# Patient Record
Sex: Female | Born: 1965 | Race: White | Hispanic: No | Marital: Married | Smoking: Never smoker
Health system: Southern US, Community
[De-identification: ages and names within clinical notes are randomized; demographics above are authoritative.]

## PROBLEM LIST (undated history)

## (undated) DIAGNOSIS — C50919 Malignant neoplasm of unspecified site of unspecified female breast: Secondary | ICD-10-CM

## (undated) DIAGNOSIS — R112 Nausea with vomiting, unspecified: Secondary | ICD-10-CM

## (undated) DIAGNOSIS — IMO0002 Reserved for concepts with insufficient information to code with codable children: Secondary | ICD-10-CM

## (undated) DIAGNOSIS — T8859XA Other complications of anesthesia, initial encounter: Secondary | ICD-10-CM

## (undated) DIAGNOSIS — F909 Attention-deficit hyperactivity disorder, unspecified type: Secondary | ICD-10-CM

## (undated) DIAGNOSIS — F32A Depression, unspecified: Secondary | ICD-10-CM

## (undated) DIAGNOSIS — N3289 Other specified disorders of bladder: Secondary | ICD-10-CM

## (undated) DIAGNOSIS — T4145XA Adverse effect of unspecified anesthetic, initial encounter: Secondary | ICD-10-CM

## (undated) DIAGNOSIS — G43909 Migraine, unspecified, not intractable, without status migrainosus: Secondary | ICD-10-CM

## (undated) DIAGNOSIS — E039 Hypothyroidism, unspecified: Secondary | ICD-10-CM

## (undated) DIAGNOSIS — T7840XA Allergy, unspecified, initial encounter: Secondary | ICD-10-CM

## (undated) DIAGNOSIS — F419 Anxiety disorder, unspecified: Secondary | ICD-10-CM

## (undated) DIAGNOSIS — R55 Syncope and collapse: Secondary | ICD-10-CM

## (undated) DIAGNOSIS — O039 Complete or unspecified spontaneous abortion without complication: Secondary | ICD-10-CM

## (undated) DIAGNOSIS — Z9889 Other specified postprocedural states: Secondary | ICD-10-CM

## (undated) DIAGNOSIS — Z62810 Personal history of physical and sexual abuse in childhood: Secondary | ICD-10-CM

## (undated) HISTORY — DX: Syncope and collapse: R55

## (undated) HISTORY — PX: BUNIONECTOMY: SHX129

## (undated) HISTORY — DX: Reserved for concepts with insufficient information to code with codable children: IMO0002

## (undated) HISTORY — DX: Complete or unspecified spontaneous abortion without complication: O03.9

## (undated) HISTORY — DX: Malignant neoplasm of unspecified site of unspecified female breast: C50.919

## (undated) HISTORY — DX: Personal history of physical and sexual abuse in childhood: Z62.810

---

## 1988-10-05 HISTORY — PX: MEATOPLASTY: SHX2011

## 2001-08-11 ENCOUNTER — Other Ambulatory Visit: Admission: RE | Admit: 2001-08-11 | Discharge: 2001-08-11 | Payer: Self-pay | Admitting: Obstetrics and Gynecology

## 2002-10-23 ENCOUNTER — Other Ambulatory Visit: Admission: RE | Admit: 2002-10-23 | Discharge: 2002-10-23 | Payer: Self-pay | Admitting: Obstetrics and Gynecology

## 2003-04-10 ENCOUNTER — Encounter: Admission: RE | Admit: 2003-04-10 | Discharge: 2003-04-10 | Payer: Self-pay | Admitting: Obstetrics and Gynecology

## 2003-04-10 ENCOUNTER — Encounter: Payer: Self-pay | Admitting: Obstetrics and Gynecology

## 2004-02-01 ENCOUNTER — Other Ambulatory Visit: Admission: RE | Admit: 2004-02-01 | Discharge: 2004-02-01 | Payer: Self-pay | Admitting: Obstetrics and Gynecology

## 2005-02-19 ENCOUNTER — Other Ambulatory Visit: Admission: RE | Admit: 2005-02-19 | Discharge: 2005-02-19 | Payer: Self-pay | Admitting: Obstetrics and Gynecology

## 2005-03-12 ENCOUNTER — Ambulatory Visit: Payer: Self-pay | Admitting: Cardiology

## 2005-04-02 ENCOUNTER — Ambulatory Visit: Payer: Self-pay | Admitting: Cardiology

## 2006-04-20 ENCOUNTER — Ambulatory Visit: Payer: Self-pay | Admitting: Cardiology

## 2006-12-20 ENCOUNTER — Ambulatory Visit (HOSPITAL_COMMUNITY): Admission: RE | Admit: 2006-12-20 | Discharge: 2006-12-20 | Payer: Self-pay | Admitting: Obstetrics and Gynecology

## 2007-09-16 ENCOUNTER — Ambulatory Visit (HOSPITAL_COMMUNITY): Admission: RE | Admit: 2007-09-16 | Discharge: 2007-09-16 | Payer: Self-pay | Admitting: Obstetrics and Gynecology

## 2008-03-20 ENCOUNTER — Inpatient Hospital Stay (HOSPITAL_COMMUNITY): Admission: AD | Admit: 2008-03-20 | Discharge: 2008-03-24 | Payer: Self-pay | Admitting: Obstetrics & Gynecology

## 2008-03-20 ENCOUNTER — Encounter (INDEPENDENT_AMBULATORY_CARE_PROVIDER_SITE_OTHER): Payer: Self-pay | Admitting: Obstetrics & Gynecology

## 2008-04-02 ENCOUNTER — Ambulatory Visit: Admission: RE | Admit: 2008-04-02 | Discharge: 2008-04-02 | Payer: Self-pay | Admitting: Obstetrics and Gynecology

## 2008-04-04 HISTORY — PX: TUBAL LIGATION: SHX77
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
## 2011-02-17 NOTE — Op Note (Signed)
Kristin Spears, Kristin Spears               ACCOUNT NO.:  [id number]   MEDICAL RECORD NO.:  [id number]          PATIENT TYPE:  AMB   LOCATION:  SDC                           [facility]ILITY:  [facility]   PHYSICIAN:  Guy Sandifer. Henderson Cloud, M.D. DATE OF BIRTH:  Oct 29, 1965   DATE OF PROCEDURE:  09/16/2007  DATE OF DISCHARGE:                               OPERATIVE REPORT   PREOPERATIVE DIAGNOSES:  1. Anterior pregnancy at 13 weeks' estimated additional age..  2. T-shaped uterus.   POSTOPERATIVE DIAGNOSES:  1. Anterior pregnancy at 13 weeks' estimated additional age..  2. T-shaped uterus.   PROCEDURE:  McDonald cervical cerclage.   SURGEON:  Guy Sandifer. Henderson Cloud, M.D.   ANESTHESIA:  Spinal.   BLOOD LOSS:  Minimal.   INDICATIONS AND CONSENT:  This patient is a 45 year old married white  female G3, P0, at approximately 9 weeks' estimated additional age.  She  has a T-shaped uterus.  Details are dictated in the history and  physical.  She is admitted for a McDonald cervical cerclage.  Potential  risks and complications have been discussed preoperatively including but  not limited to infection, bleeding with transfusion of blood products  with HIV and hepatitis acquisition, pregnancy loss.  All questions have  been answered and consent is signed on the chart.   PROCEDURE:  The patient is taken to the operating room, where she is  identified, a spinal anesthetic is placed, and she is placed in the  dorsal supine position.  She is then prepped, the bladder straight-  catheterized, and draped in a sterile fashion.  The cervix is closed,  thick and high.  A weighted speculum is placed and the anterior  retractor is placed.  Novofil #1 is used to perform a McDonald cerclage  starting and ending at the 12 o'clock position without difficulty.  Good  hemostasis is noted at the completion of the cerclage.  All counts are  correct.  The patient is transferred to the recovery room in stable  condition.      Guy Sandifer  Henderson Cloud, M.D.  Electronically Signed     JET/MEDQ  D:  09/16/2007  T:  09/16/2007  Job:  [id number]

## 2011-02-17 NOTE — Op Note (Signed)
Kristin Spears, Kristin Spears               ACCOUNT NO.:  [id number]   MEDICAL RECORD NO.:  [id number]          PATIENT TYPE:  INP   LOCATION:  9128                          [facility]ILITY:  [facility]   PHYSICIAN:  Guy Sandifer. Henderson Cloud, M.D. DATE OF BIRTH:  04/16/1966   DATE OF PROCEDURE:  03/20/2008  DATE OF DISCHARGE:                               OPERATIVE REPORT   PREOPERATIVE DIAGNOSES:  1. Intrauterine pregnancy at 48-0/7th weeks' estimated gestational      age.  2. Arrest of cervical dilation.  3. Desires permanent sterilization.   POSTOPERATIVE DIAGNOSES:  1. Intrauterine pregnancy at 48-0/7th weeks' estimated gestational      age.  2. Arrest of cervical dilation.  3. Desires permanent sterilization.   PROCEDURE:  Low-transverse cesarean section and bilateral tubal  ligation.   SURGEON:  Harold Hedge, MD   ASSISTANT:  Freddy Finner, MD   ANESTHESIA:  Epidural.   ESTIMATED BLOOD LOSS:  600 mL.   FINDINGS:  Viable female infant, Apgars of 9 at 9 and 1 at 5 minutes  respectively.  Birth weight and arterial pH are pending.   SPECIMENS:  Placenta to pathology.   INDICATIONS AND CONSENT:  This patient is a 45 year old married white  female G3, P0 with an EDC of March 20, 2008.  Pregnancy has been  complicated by a T-shaped uterus on HSG.  A cerclage was placed early in  pregnancy and was removed at 37 weeks.  Pregnancy has also been  complicated by advanced maternal age.  First trimester screening was  normal.  Group B beta strep culture is negative.  The patient presents  with uterine contractions.  As of 2 p.m., cervix is 2 cm dilated, 90%  effaced, -3 station, and vertex.  Artificial rupture of membranes is  productive of light green meconium.  The patient progresses to 5-cm  dilation with no change with adequate labor.  Cesarean section was then  discussed with the patient.  The patient also requests permanent  sterilization.  Discussed with the patient and her husband permanence of  the procedure, failure rate, and increased ectopic risk.  All questions  have been answered and consent is signed on the chart.   PROCEDURE:  The patient is taken to the operating room where epidural  anesthetic is augmented to a surgical level.  She is then placed in  dorsal supine position with 15-degree left lateral wedge.  She is  prepped, Foley catheter was already in place, and draped in a sterile  fashion.  After testing for adequate epidural anesthesia, skin is  entered through a Pfannenstiel incision and dissection is carried out in  layers to the peritoneum.  Peritoneum is incised, extended superiorly  and inferiorly.  Vesicouterine peritoneum was taken down cephalad  laterally.  Bladder flap was developed and the bladder blade was placed.  Uterus was incised in low-transverse manner, and the uterine cavity is  entered bluntly with a hemostat.  The uterine incision is extended  cephalad laterally.  Vertex was then delivered and the oropharynx and  nasopharynx are suctioned with the DeLee suction.  This is productive of a small amount of very very thin meconium.  Remainder of the baby is then delivered without difficulty.  Oropharynx  and nasopharynx are suctioned.  Cord is clamped and cut, and the baby  was handed away to the pediatrics team.  Placenta is manually delivered  and sent to pathology.  Uterine cavity is clean.  Uterus is closed in  two running locking imbricating layers of 0 Monocryl suture, which  achieved good hemostasis.  A single figure-of-eight is placed on the  left angle of the uterine incision to achieve complete hemostasis.   Left fallopian tube was identified from cornua to fimbriae.  It is  grasped in its mid-ampullary portion with a Babcock clamp.  A knuckle of  tube was then doubly ligated with 2 free ties of 0 plain suture.  The  intervening knuckle of tube is then resected in sharp fashion.  Cautery  was used to obtain complete hemostasis.  Similar  procedure was carried  out on the right tube.  Both ovaries appear normal.  Irrigation is  carried out.  Anterior peritoneum was then closed in running fashion  with 0 Monocryl suture, which was also used to reapproximate the  pyramidalis muscle in the midline.  Anterior rectus fascia is closed in  a running fashion with 0 PDS suture, and the skin is closed with clips.  All sponge, instrument, and needle counts were correct; and the patient  is transferred to the recovery room in stable condition.      Guy Sandifer Henderson Cloud, M.D.  Electronically Signed     JET/MEDQ  D:  03/20/2008  T:  03/21/2008  Job:  [id number]
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
## 2011-02-20 NOTE — Discharge Summary (Signed)
Kristin Spears, Kristin Spears               ACCOUNT NO.:  [id number]   MEDICAL RECORD NO.:  [id number]          PATIENT TYPE:  INP   LOCATION:  9132                          FACILITY:  [facility]   PHYSICIAN:  Duke Salvia. Marcelle Overlie, M.D.DATE OF BIRTH:  14-Oct-1965   DATE OF ADMISSION:  03/20/2008  DATE OF DISCHARGE:  03/24/2008                               DISCHARGE SUMMARY   ADMITTING DIAGNOSES:  1. Intrauterine pregnancy at term.  2. Spontaneous onset of labor.   DISCHARGE DIAGNOSES:  1. Status post low-transverse cesarean section secondary to arrest of      cervical dilatation.  2. Multiparity, desires permanent sterilization.   PROCEDURE:  1. Low-transverse cesarean section.  2. Bilateral tubal ligation.   REASON FOR ADMISSION:  Please see written H&P.   HOSPITAL COURSE:  The patient is 45 year old white married female  gravida 3, abortus 2, para 0 that was admitted to [facility] at 40-0/7 weeks' estimated gestational age with spontaneous  onset of labor.  Pregnancy had been complicated by C-shaped uterus with  a cerclage, which had been placed earlier in the pregnancy and removed  at 37 weeks.  The patient was also known to have advanced maternal age  and she had undergone first trimester screen which was normal.  Group B  beta strep was negative.  On admission, vital signs were stable.  Cervix  was dilated 2 cm, 90% effaced, vertex at a -3 station.  Artificial  rupture of membranes was performed, which revealed light stained  meconium fluid.  Fetal heart tones were reactive and contractions were  noted to be every 3-4 minutes.  The patient did progress to 5 cm;  however, she made no further change in her cervix, and decision was made  to proceed with a primary low-transverse cesarean section.  The patient  was then transferred to the operating room where epidural was dosed to  an adequate surgical level.  A low-transverse incision was made with  delivery of a viable female  infant weighing 8 pounds 3 ounces with Apgars  of 8 at 1 minute and 9 at 5 minutes.  The patient tolerated the  procedure well and taken to the recovery room in stable condition.  On  postoperative day #1, the patient was without complaint.  She denied  headache or blurred vision or right upper quadrant pain.  Vital signs  were stable.  Blood pressure 128//81 to 134 /75.  Deep tendon reflexes  are 1+.  No clonus.  No pedal edema was noted.  Abdomen soft with  slightly distended and decreased bowel sounds.  Fundus was firm and  nontender.  Abdominal dressing noted to be clean, dry and intact.  Foley  was noted to have some concentrated urine.  Laboratory findings revealed  hemoglobin of 12.7, platelet count 114,000, WBC count of 15.6.  Liver  function tests were pending at the time of rounding.  IV fluids were  increased and Zofran was given for some nausea and Darvocet for pain.  On postoperative day #2, the patient was without complaint.  Vital signs  were stable.  Blood pressure 106/77 to 119/80.  Deep tendon reflexes  were 1+.  Abdomen soft.  Fundus firm and nontender.  Abdominal dressing  had been removed revealing an incision that is clean, dry and intact.  Small skin reaction was noted from the adhesive superior to the  incisional site.  Laboratory findings revealed hemoglobin of 11.2,  platelet count was 104,000, WBC count of 11.7.  Liver function tests  revealed AST of 51 and ALT of 28, alkaline phosphatase 146 and uric acid  was 6.9.  A decision was made to start the patient on some magnesium  sulfate and transfer to the AICU.  Later that evening the patient did  complain of some headache.  Vital signs were stable.  Urine output was  good.  Lungs were clear to auscultation.  Heart was regular rate and  rhythm.  Abdomen: Soft.  Labs looked signs.  She continued on magnesium  sulfate.  On postoperative day #3, the patient was feeling better.  Blood pressure was 140/88.  Lungs were  clear to auscultation.  Incision  was clean.  Urine output revealed 1300 mL over the last 8 hours.  A  decision was made to discontinue the magnesium sulfate at noon and  recheck the [facility] labs.  On postoperative day #4, the patient was without  complaint.  Vital signs were stable.  Blood pressure 139/84.  She was  afebrile.  Incision was clean, dry and intact.  SGOT and PT were now  within normal limits.  Other labs revealed hemoglobin of 13.1, platelet  count of 195,000.  Discharge instructions were reviewed and the patient  was later discharged home.   CONDITION ON DISCHARGE:  Stable.   DIET:  Regular as tolerated.   ACTIVITY:  No heavy lifting, no driving x2 weeks, no vaginal entry.   FOLLOWUP:  Patient to follow up in the office in 2-3 days for an  incision check and blood pressure check.  She is to call for temperature  greater than 100 degrees, persistent nausea, vomiting, heavy vaginal  bleeding and/or redness or drainage from the incisional site.  The  patient was also instructed to call for headache, blurred vision or  right upper quadrant pain.   DISCHARGE MEDICATIONS:  1. Tylox #30 one p.o. 4-6 hours p.r.n.  2. Prenatal vitamins 1 p.o. daily.  3. Protonix 40 mg 1 p.o. daily for GERD.  4. Allegra 180 mg 1 p.o. daily p.r.n. allergies.      Julio Sicks, N.P.      Richard M. Marcelle Overlie, M.D.  Electronically Signed    CC/MEDQ  D:  04/10/2008  T:  04/10/2008  Job:  [id number]

## 2011-07-02 LAB — CBC
HCT: 35.7 — ABNORMAL LOW
HCT: 38.1
HCT: 38.6
HCT: 41.4
Hemoglobin: 12
Hemoglobin: 13.1
MCHC: 33.9
MCHC: 34.4
MCHC: 35.3
MCHC: 35.6
MCV: 102.1 — ABNORMAL HIGH
MCV: 102.3 — ABNORMAL HIGH
MCV: 102.5 — ABNORMAL HIGH
MCV: 103 — ABNORMAL HIGH
MCV: 103 — ABNORMAL HIGH
MCV: 104.1 — ABNORMAL HIGH
Platelets: 112 — ABNORMAL LOW
Platelets: 114 — ABNORMAL LOW
Platelets: 127 — ABNORMAL LOW
Platelets: 128 — ABNORMAL LOW
RBC: 3.12 — ABNORMAL LOW
RBC: 3.46 — ABNORMAL LOW
RBC: 3.7 — ABNORMAL LOW
RDW: 12.7
RDW: 13.2
RDW: 13.4
WBC: 11.2 — ABNORMAL HIGH
WBC: 12 — ABNORMAL HIGH
WBC: 14.5 — ABNORMAL HIGH
WBC: 15.6 — ABNORMAL HIGH
WBC: 9.8

## 2011-07-02 LAB — DIFFERENTIAL
Basophils Absolute: 0
Basophils Relative: 0
Eosinophils Relative: 0
Lymphocytes Relative: 10 — ABNORMAL LOW
Monocytes Absolute: 0.9

## 2011-07-02 LAB — COMPREHENSIVE METABOLIC PANEL
ALT: 31
AST: 30
AST: 46 — ABNORMAL HIGH
AST: 51 — ABNORMAL HIGH
Albumin: 2.1 — ABNORMAL LOW
Albumin: 2.2 — ABNORMAL LOW
Albumin: 2.4 — ABNORMAL LOW
Albumin: 2.4 — ABNORMAL LOW
Alkaline Phosphatase: 123 — ABNORMAL HIGH
Alkaline Phosphatase: 125 — ABNORMAL HIGH
Alkaline Phosphatase: 146 — ABNORMAL HIGH
BUN: 11
BUN: 6
BUN: 9
CO2: 23
CO2: 28
Calcium: 8.3 — ABNORMAL LOW
Chloride: 101
Chloride: 102
Chloride: 103
Chloride: 104
Chloride: 105
Chloride: 107
Creatinine, Ser: 0.57
Creatinine, Ser: 0.64
Creatinine, Ser: 0.67
Creatinine, Ser: 0.68
Creatinine, Ser: 0.83
GFR calc Af Amer: >60
GFR calc Af Amer: >60
GFR calc Af Amer: >60
GFR calc non Af Amer: >60
GFR calc non Af Amer: >60
Glucose, Bld: 76
Potassium: 3.6
Potassium: 3.8
Sodium: 134 — ABNORMAL LOW
Total Bilirubin: 0.3
Total Bilirubin: 0.5
Total Bilirubin: 0.5
Total Bilirubin: 0.6
Total Bilirubin: 0.6
Total Protein: 5.3 — ABNORMAL LOW
Total Protein: 5.8 — ABNORMAL LOW

## 2011-07-02 LAB — URIC ACID
Uric Acid, Serum: 5.7
Uric Acid, Serum: 6.2
Uric Acid, Serum: 6.8

## 2011-07-02 LAB — MAGNESIUM
Magnesium: 2.7 — ABNORMAL HIGH
Magnesium: 6.3

## 2011-07-02 LAB — LACTATE DEHYDROGENASE: LDH: 195

## 2011-07-13 LAB — CBC
Hemoglobin: 13.7
MCHC: 34.8
RBC: 3.98
WBC: 10.2

## 2011-10-15 ENCOUNTER — Telehealth: Payer: Self-pay | Admitting: Cardiology

## 2011-10-15 NOTE — Telephone Encounter (Signed)
New problem Pt called and said she was having rapid heartbeat last night and wanted to talk to you

## 2011-10-16 NOTE — Telephone Encounter (Signed)
Fu call °Patient returning your call °

## 2011-10-16 NOTE — Telephone Encounter (Signed)
Appt made to see dr wall.

## 2011-11-19 ENCOUNTER — Ambulatory Visit: Payer: Self-pay | Admitting: Cardiology

## 2011-11-20 ENCOUNTER — Encounter: Payer: Self-pay | Admitting: *Deleted

## 2011-11-20 ENCOUNTER — Ambulatory Visit (INDEPENDENT_AMBULATORY_CARE_PROVIDER_SITE_OTHER): Payer: Self-pay | Admitting: Cardiology

## 2011-11-20 VITALS — BP 94/61 | HR 78 | Ht 66.0 in | Wt 139.0 lb

## 2011-11-20 DIAGNOSIS — R55 Syncope and collapse: Secondary | ICD-10-CM

## 2011-11-20 DIAGNOSIS — Z8249 Family history of ischemic heart disease and other diseases of the circulatory system: Secondary | ICD-10-CM
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
## 2011-11-20 NOTE — Patient Instructions (Signed)
Follow up with Dr. Daleen Squibb as needed.

## 2011-11-20 NOTE — Assessment & Plan Note (Signed)
Stable. Reviewed once again ways to avoid syncope he has prodromal symptoms. Advised to stay well hydrated and to liberalize salt intake. He is very intelligent and aware of ways to avoid this.

## 2011-11-20 NOTE — Assessment & Plan Note (Signed)
She is at low risk of having a cardiac event with no conventional risk factors including hyperlipidemia. Reassurance given.

## 2012-01-14 ENCOUNTER — Telehealth: Payer: Self-pay | Admitting: Cardiology

## 2012-01-14 NOTE — Telephone Encounter (Signed)
Pt calling re if dr wall would allow pt to do hot yoga? pls call

## 2012-01-14 NOTE — Telephone Encounter (Signed)
Returned call and pt states she did not think she would be able to do the hot yoga because she her vasovagal response has occurred in the past when she stepped out of a hot bath. Hot yoga is done in a room with a 100.5 F moist heated room Pt will not do hot yoga. Dr. Daleen Squibb aware. Mylo Red RN

## 2012-06-23 ENCOUNTER — Other Ambulatory Visit: Payer: Self-pay | Admitting: Obstetrics and Gynecology

## 2012-06-23 DIAGNOSIS — N6320 Unspecified lump in the left breast, unspecified quadrant: Secondary | ICD-10-CM

## 2012-06-27 ENCOUNTER — Other Ambulatory Visit: Payer: BC Managed Care – PPO

## 2012-06-28 ENCOUNTER — Ambulatory Visit
Admission: RE | Admit: 2012-06-28 | Discharge: 2012-06-28 | Disposition: A | Discharge status: Home / Self Care | Payer: BC Managed Care – PPO | Source: Ambulatory Visit | Attending: Obstetrics and Gynecology | Admitting: Obstetrics and Gynecology

## 2012-06-28 ENCOUNTER — Other Ambulatory Visit: Payer: Self-pay | Admitting: Obstetrics and Gynecology

## 2012-06-28 DIAGNOSIS — N6320 Unspecified lump in the left breast, unspecified quadrant: Secondary | ICD-10-CM

## 2012-06-28 DIAGNOSIS — C50919 Malignant neoplasm of unspecified site of unspecified female breast: Secondary | ICD-10-CM

## 2012-06-28 HISTORY — DX: Malignant neoplasm of unspecified site of unspecified female breast: C50.919

## 2012-06-28 HISTORY — PX: OTHER SURGICAL HISTORY: SHX169

## 2012-06-29 ENCOUNTER — Ambulatory Visit
Admission: RE | Admit: 2012-06-29 | Discharge: 2012-06-29 | Disposition: A | Discharge status: Home / Self Care | Payer: BC Managed Care – PPO | Source: Ambulatory Visit | Attending: Obstetrics and Gynecology | Admitting: Obstetrics and Gynecology

## 2012-06-29 ENCOUNTER — Other Ambulatory Visit: Payer: Self-pay | Admitting: Obstetrics and Gynecology

## 2012-06-29 DIAGNOSIS — C50912 Malignant neoplasm of unspecified site of left female breast: Secondary | ICD-10-CM

## 2012-06-29 DIAGNOSIS — N6320 Unspecified lump in the left breast, unspecified quadrant: Secondary | ICD-10-CM

## 2012-07-04 ENCOUNTER — Other Ambulatory Visit: Payer: Self-pay | Admitting: Obstetrics and Gynecology

## 2012-07-04 ENCOUNTER — Ambulatory Visit
Admission: RE | Admit: 2012-07-04 | Discharge: 2012-07-04 | Disposition: A | Discharge status: Home / Self Care | Payer: BC Managed Care – PPO | Source: Ambulatory Visit | Attending: Obstetrics and Gynecology | Admitting: Obstetrics and Gynecology

## 2012-07-04 ENCOUNTER — Telehealth: Payer: Self-pay | Admitting: *Deleted

## 2012-07-04 ENCOUNTER — Other Ambulatory Visit: Payer: Self-pay | Admitting: *Deleted

## 2012-07-04 DIAGNOSIS — C50212 Malignant neoplasm of upper-inner quadrant of left female breast: Secondary | ICD-10-CM | POA: Insufficient documentation

## 2012-07-04 DIAGNOSIS — C50219 Malignant neoplasm of upper-inner quadrant of unspecified female breast: Secondary | ICD-10-CM

## 2012-07-04 DIAGNOSIS — R928 Other abnormal and inconclusive findings on diagnostic imaging of breast: Secondary | ICD-10-CM

## 2012-07-04 DIAGNOSIS — C50912 Malignant neoplasm of unspecified site of left female breast: Secondary | ICD-10-CM

## 2012-07-04 HISTORY — PX: OTHER SURGICAL HISTORY: SHX169

## 2012-07-04 MED ORDER — GADOBENATE DIMEGLUMINE 529 MG/ML IV SOLN
13.0000 mL | Freq: Once | INTRAVENOUS | Status: AC | PRN
  Administered 2012-07-04: 13 mL via INTRAVENOUS

## 2012-07-04 NOTE — Telephone Encounter (Signed)
Confirmed [facility] for 07/04/12 at 0800 .  Instructions and contact information given.

## 2012-07-06 ENCOUNTER — Encounter (INDEPENDENT_AMBULATORY_CARE_PROVIDER_SITE_OTHER): Payer: Self-pay | Admitting: General Surgery

## 2012-07-06 ENCOUNTER — Ambulatory Visit (HOSPITAL_BASED_OUTPATIENT_CLINIC_OR_DEPARTMENT_OTHER): Payer: BC Managed Care – PPO

## 2012-07-06 ENCOUNTER — Ambulatory Visit
Admission: RE | Admit: 2012-07-06 | Discharge: 2012-07-06 | Disposition: A | Discharge status: Home / Self Care | Payer: BC Managed Care – PPO | Source: Ambulatory Visit | Attending: Radiation Oncology | Admitting: Radiation Oncology

## 2012-07-06 ENCOUNTER — Ambulatory Visit: Payer: BC Managed Care – PPO | Attending: General Surgery | Admitting: Physical Therapy

## 2012-07-06 ENCOUNTER — Encounter: Payer: Self-pay | Admitting: *Deleted

## 2012-07-06 ENCOUNTER — Ambulatory Visit (HOSPITAL_BASED_OUTPATIENT_CLINIC_OR_DEPARTMENT_OTHER): Payer: BC Managed Care – PPO | Admitting: General Surgery

## 2012-07-06 ENCOUNTER — Encounter: Payer: Self-pay | Admitting: Oncology

## 2012-07-06 ENCOUNTER — Other Ambulatory Visit (HOSPITAL_BASED_OUTPATIENT_CLINIC_OR_DEPARTMENT_OTHER): Payer: BC Managed Care – PPO | Admitting: Lab

## 2012-07-06 ENCOUNTER — Ambulatory Visit (HOSPITAL_BASED_OUTPATIENT_CLINIC_OR_DEPARTMENT_OTHER): Payer: BC Managed Care – PPO | Admitting: Oncology

## 2012-07-06 VITALS — BP 133/83 | HR 70 | Temp 98.7°F | Resp 20 | Ht 66.0 in | Wt 142.8 lb

## 2012-07-06 DIAGNOSIS — M545 Low back pain, unspecified: Secondary | ICD-10-CM | POA: Insufficient documentation

## 2012-07-06 DIAGNOSIS — Z170 Estrogen receptor positive status [ER+]: Secondary | ICD-10-CM

## 2012-07-06 DIAGNOSIS — C50219 Malignant neoplasm of upper-inner quadrant of unspecified female breast: Secondary | ICD-10-CM

## 2012-07-06 DIAGNOSIS — C50919 Malignant neoplasm of unspecified site of unspecified female breast: Secondary | ICD-10-CM

## 2012-07-06 DIAGNOSIS — R293 Abnormal posture: Secondary | ICD-10-CM | POA: Insufficient documentation

## 2012-07-06 DIAGNOSIS — M542 Cervicalgia: Secondary | ICD-10-CM | POA: Insufficient documentation

## 2012-07-06 DIAGNOSIS — Z803 Family history of malignant neoplasm of breast: Secondary | ICD-10-CM

## 2012-07-06 DIAGNOSIS — IMO0001 Reserved for inherently not codable concepts without codable children: Secondary | ICD-10-CM | POA: Insufficient documentation

## 2012-07-06 LAB — CBC WITH DIFFERENTIAL/PLATELET
BASO%: 0.2 % (ref 0.0–2.0)
Basophils Absolute: 0 10*3/uL (ref 0.0–0.1)
Eosinophils Absolute: 0.2 10*3/uL (ref 0.0–0.5)
HCT: 41.6 % (ref 34.8–46.6)
HGB: 14.2 g/dL (ref 11.6–15.9)
LYMPH%: 28.1 % (ref 14.0–49.7)
MCHC: 34.1 g/dL (ref 31.5–36.0)
MONO#: 0.4 10*3/uL (ref 0.1–0.9)
NEUT#: 3.2 10*3/uL (ref 1.5–6.5)
NEUT%: 61.6 % (ref 38.4–76.8)
Platelets: 220 10*3/uL (ref 145–400)
WBC: 5.3 10*3/uL (ref 3.9–10.3)
lymph#: 1.5 10*3/uL (ref 0.9–3.3)

## 2012-07-06 LAB — COMPREHENSIVE METABOLIC PANEL (CC13)
ALT: 11 U/L (ref 0–55)
CO2: 25 mEq/L (ref 22–29)
Calcium: 9 mg/dL (ref 8.4–10.4)
Chloride: 105 mEq/L (ref 98–107)
Creatinine: 0.8 mg/dL (ref 0.6–1.1)
Glucose: 87 mg/dl (ref 70–99)
Total Bilirubin: 0.5 mg/dL (ref 0.20–1.20)

## 2012-07-06 LAB — CANCER ANTIGEN 27.29: CA 27.29: 32 U/mL (ref 0–39)

## 2012-07-06 NOTE — Progress Notes (Signed)
Checked in new pt with no financial concerns. °

## 2012-07-06 NOTE — Patient Instructions (Signed)
We will call you when we are ready to schedule your surgery. 
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
## 2012-07-07 ENCOUNTER — Ambulatory Visit
Admission: RE | Admit: 2012-07-07 | Discharge: 2012-07-07 | Disposition: A | Discharge status: Home / Self Care | Payer: BC Managed Care – PPO | Source: Ambulatory Visit | Attending: Obstetrics and Gynecology | Admitting: Obstetrics and Gynecology

## 2012-07-07 ENCOUNTER — Encounter: Payer: Self-pay | Admitting: Genetic Counselor

## 2012-07-07 ENCOUNTER — Other Ambulatory Visit: Payer: BC Managed Care – PPO

## 2012-07-07 ENCOUNTER — Ambulatory Visit (HOSPITAL_BASED_OUTPATIENT_CLINIC_OR_DEPARTMENT_OTHER): Payer: BC Managed Care – PPO | Admitting: Genetic Counselor

## 2012-07-07 DIAGNOSIS — R928 Other abnormal and inconclusive findings on diagnostic imaging of breast: Secondary | ICD-10-CM

## 2012-07-07 DIAGNOSIS — C50919 Malignant neoplasm of unspecified site of unspecified female breast: Secondary | ICD-10-CM

## 2012-07-07 DIAGNOSIS — C50219 Malignant neoplasm of upper-inner quadrant of unspecified female breast: Secondary | ICD-10-CM

## 2012-07-07 DIAGNOSIS — IMO0002 Reserved for concepts with insufficient information to code with codable children: Secondary | ICD-10-CM

## 2012-07-07 HISTORY — PX: OTHER SURGICAL HISTORY: SHX169

## 2012-07-07 MED ORDER — GADOBENATE DIMEGLUMINE 529 MG/ML IV SOLN: 13.0000 mL | Freq: Once | INTRAVENOUS | Status: DC | PRN
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
## 2012-07-08 ENCOUNTER — Encounter: Payer: Self-pay | Admitting: *Deleted

## 2012-07-08 NOTE — Progress Notes (Signed)
[facility] Psychosocial Distress Screening Clinical Social Work  Patient completed distress screening protocol, and scored a 3 on the Psychosocial Distress Thermometer which indicates mild distress. Clinical Social Worker met with patient and patient's husband in  [facility] to assess for distress and other psychosocial needs.  Pt stated she was doing "ok", and felt "a little" better after getting information form all the physicians.  CSW informed pt pf the Patient and Family support team and support services at [facility].  CSW also informed pt of resources for her son and materials to assist with "talking to your children about cancer", and children's books.  CSW encouraged pt to call with any questions or concerns.      Clinical Social Worker follow up needed: not at this time  Tamala Julian, MSW, LCSW Clinical Social Worker [facility] [phone]

## 2012-07-11 ENCOUNTER — Telehealth: Payer: Self-pay | Admitting: *Deleted

## 2012-07-11 ENCOUNTER — Telehealth (INDEPENDENT_AMBULATORY_CARE_PROVIDER_SITE_OTHER): Payer: Self-pay | Admitting: General Surgery

## 2012-07-11 NOTE — Telephone Encounter (Signed)
Patient returning your call. Please call when you can [phone].

## 2012-07-11 NOTE — Telephone Encounter (Signed)
Pt call stating she would like to know if insurance would cover right mastectomy since no abnormalities noted.  Informed pt that I would get in contact with [facility] financial counselors with her concerns.  Pt denies further needs at this time.  Encourage pt to call with further questions.

## 2012-07-12 ENCOUNTER — Encounter (INDEPENDENT_AMBULATORY_CARE_PROVIDER_SITE_OTHER): Payer: Self-pay | Admitting: General Surgery

## 2012-07-12 ENCOUNTER — Other Ambulatory Visit (INDEPENDENT_AMBULATORY_CARE_PROVIDER_SITE_OTHER): Payer: Self-pay | Admitting: General Surgery

## 2012-07-12 DIAGNOSIS — C50912 Malignant neoplasm of unspecified site of left female breast: Secondary | ICD-10-CM
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
## 2012-07-12 NOTE — Progress Notes (Unsigned)
Patient ID: Kristin Spears, female   DOB: Jun 13, 1966, 46 y.o.   MRN: [id number] I spoke with her today.  The MRI guided biopsy of one of the satellite lesions in the left breast is positive for invasive lobular carcinoma.  She did decide to have genetic testing and those results are pending.  In speaking with her about surgical options again, she is interested in proceeding with bilateral mastectomies, no matter what the genetic testing shows, and reconstruction.  Will refer her to a plastic surgeon and then coordinate the procedures after that consultation.

## 2012-07-26 ENCOUNTER — Other Ambulatory Visit (INDEPENDENT_AMBULATORY_CARE_PROVIDER_SITE_OTHER): Payer: Self-pay | Admitting: General Surgery

## 2012-07-26 DIAGNOSIS — C50919 Malignant neoplasm of unspecified site of unspecified female breast: Secondary | ICD-10-CM

## 2012-08-02 ENCOUNTER — Encounter: Payer: Self-pay | Admitting: *Deleted

## 2012-08-02 ENCOUNTER — Telehealth: Payer: Self-pay | Admitting: *Deleted

## 2012-08-02 NOTE — Telephone Encounter (Signed)
Patient confirmed over the phone the new date and time on 09-22-2012 starting at 12:00pm

## 2012-08-10 ENCOUNTER — Encounter: Payer: Self-pay | Admitting: *Deleted

## 2012-08-10 NOTE — Progress Notes (Signed)
Mailed after appt letter to pt. 

## 2012-08-12 ENCOUNTER — Encounter: Payer: Self-pay | Admitting: Genetic Counselor

## 2012-08-12 ENCOUNTER — Telehealth: Payer: Self-pay | Admitting: Genetic Counselor

## 2012-08-12 NOTE — Telephone Encounter (Signed)
LVMM that we have good news on her test results.  Asked patient to CB.

## 2012-08-17 ENCOUNTER — Telehealth: Payer: Self-pay | Admitting: Genetic Counselor

## 2012-08-17 NOTE — Telephone Encounter (Signed)
Revealed negative BRCAPlus testing and that BreastNext testing is pending.

## 2012-08-19 ENCOUNTER — Encounter (HOSPITAL_COMMUNITY): Payer: Self-pay | Admitting: Pharmacy Technician

## 2012-08-19 ENCOUNTER — Encounter (HOSPITAL_COMMUNITY): Payer: Self-pay

## 2012-08-19 ENCOUNTER — Encounter (HOSPITAL_COMMUNITY)
Admission: RE | Admit: 2012-08-19 | Discharge: 2012-08-19 | Disposition: A | Discharge status: Home / Self Care | Payer: BC Managed Care – PPO | Source: Ambulatory Visit | Attending: General Surgery | Admitting: General Surgery

## 2012-08-19 HISTORY — DX: Other complications of anesthesia, initial encounter: T88.59XA

## 2012-08-19 HISTORY — DX: Adverse effect of unspecified anesthetic, initial encounter: T41.45XA

## 2012-08-19 LAB — COMPREHENSIVE METABOLIC PANEL
AST: 17 U/L (ref 0–37)
Albumin: 4 g/dL (ref 3.5–5.2)
Alkaline Phosphatase: 76 U/L (ref 39–117)
Chloride: 103 mEq/L (ref 96–112)
Potassium: 4.2 mEq/L (ref 3.5–5.1)
Total Bilirubin: 0.4 mg/dL (ref 0.3–1.2)
Total Protein: 7.5 g/dL (ref 6.0–8.3)

## 2012-08-19 LAB — CBC WITH DIFFERENTIAL/PLATELET
Basophils Absolute: 0 10*3/uL (ref 0.0–0.1)
Basophils Relative: 0 % (ref 0–1)
Eosinophils Absolute: 0.2 10*3/uL (ref 0.0–0.7)
MCH: 33.3 pg (ref 26.0–34.0)
MCHC: 34.9 g/dL (ref 30.0–36.0)
Neutro Abs: 6.9 10*3/uL (ref 1.7–7.7)
Neutrophils Relative %: 71 % (ref 43–77)
Platelets: 206 10*3/uL (ref 150–400)
RDW: 12.4 % (ref 11.5–15.5)

## 2012-08-19 LAB — PROTIME-INR
INR: 0.96 (ref 0.00–1.49)
Prothrombin Time: 12.7 seconds (ref 11.6–15.2)

## 2012-08-19 LAB — SURGICAL PCR SCREEN
MRSA, PCR: NEGATIVE
Staphylococcus aureus: NEGATIVE

## 2012-08-19 NOTE — Progress Notes (Signed)
Primary Physican - Dr. Edrick Oh - [facility] Cardiologist - Dr. Daleen Squibb only as needed EKG May 2013. No other cardiac testing

## 2012-08-19 NOTE — Pre-Procedure Instructions (Signed)
20 KLEO PAIGE  08/19/2012   Your procedure is scheduled on:  Friday, November 22nd  Report to [facility] at 0730 AM.  Call this number if you have problems the morning of surgery: [phone]   Remember:   Do not eat food or drink:After Midnight.   Take these medicines the morning of surgery with A SIP OF WATER: zyrtec   Do not wear jewelry, make-up or nail polish.  Do not wear lotions, powders, or perfumes. You may wear deodorant.  Do not shave 48 hours prior to surgery. Men may shave face and neck.  Do not bring valuables to the hospital.  Contacts, dentures or bridgework may not be worn into surgery.  Leave suitcase in the car. After surgery it may be brought to your room.  For patients admitted to the hospital, checkout time is 11:00 AM the day of discharge.   Patients discharged the day of surgery will not be allowed to drive home.   Special Instructions: Shower using CHG 2 nights before surgery and the night before surgery.  If you shower the day of surgery use CHG.  Use special wash - you have one bottle of CHG for all showers.  You should use approximately 1/3 of the bottle for each shower.   Please read over the following fact sheets that you were given: Pain Booklet, Coughing and Deep Breathing, MRSA Information and Surgical Site Infection Prevention

## 2012-08-25 MED ORDER — VANCOMYCIN HCL IN DEXTROSE 1-5 GM/200ML-% IV SOLN
1000.0000 mg | INTRAVENOUS | Status: AC
  Administered 2012-08-26: 1000 mg via INTRAVENOUS
  Filled 2012-08-25: qty 200

## 2012-08-26 ENCOUNTER — Inpatient Hospital Stay (HOSPITAL_COMMUNITY)
Admission: RE | Admit: 2012-08-26 | Discharge: 2012-08-27 | DRG: 258 | Disposition: A | Discharge status: Home / Self Care | Payer: BC Managed Care – PPO | Source: Ambulatory Visit | Attending: General Surgery | Admitting: General Surgery

## 2012-08-26 ENCOUNTER — Encounter (HOSPITAL_COMMUNITY)
Admission: RE | Disposition: A | Discharge status: Home / Self Care | Payer: Self-pay | Source: Ambulatory Visit | Attending: General Surgery

## 2012-08-26 ENCOUNTER — Encounter (HOSPITAL_COMMUNITY): Payer: Self-pay | Admitting: General Practice

## 2012-08-26 ENCOUNTER — Encounter (HOSPITAL_COMMUNITY)
Admission: RE | Admit: 2012-08-26 | Discharge: 2012-08-26 | Disposition: A | Discharge status: Home / Self Care | Payer: BC Managed Care – PPO | Source: Ambulatory Visit | Attending: General Surgery | Admitting: General Surgery

## 2012-08-26 ENCOUNTER — Encounter (HOSPITAL_COMMUNITY): Payer: Self-pay | Admitting: Certified Registered Nurse Anesthetist

## 2012-08-26 ENCOUNTER — Ambulatory Visit (HOSPITAL_COMMUNITY): Payer: BC Managed Care – PPO | Admitting: Certified Registered Nurse Anesthetist

## 2012-08-26 DIAGNOSIS — Z79899 Other long term (current) drug therapy: Secondary | ICD-10-CM

## 2012-08-26 DIAGNOSIS — C50919 Malignant neoplasm of unspecified site of unspecified female breast: Secondary | ICD-10-CM

## 2012-08-26 DIAGNOSIS — Z803 Family history of malignant neoplasm of breast: Secondary | ICD-10-CM

## 2012-08-26 DIAGNOSIS — IMO0002 Reserved for concepts with insufficient information to code with codable children: Secondary | ICD-10-CM

## 2012-08-26 DIAGNOSIS — Z885 Allergy status to narcotic agent status: Secondary | ICD-10-CM

## 2012-08-26 DIAGNOSIS — Z882 Allergy status to sulfonamides status: Secondary | ICD-10-CM

## 2012-08-26 DIAGNOSIS — Z880 Allergy status to penicillin: Secondary | ICD-10-CM

## 2012-08-26 DIAGNOSIS — Z888 Allergy status to other drugs, medicaments and biological substances status: Secondary | ICD-10-CM

## 2012-08-26 DIAGNOSIS — N6019 Diffuse cystic mastopathy of unspecified breast: Secondary | ICD-10-CM

## 2012-08-26 HISTORY — DX: Migraine, unspecified, not intractable, without status migrainosus: G43.909

## 2012-08-26 HISTORY — DX: Attention-deficit hyperactivity disorder, unspecified type: F90.9

## 2012-08-26 HISTORY — PX: MASTECTOMY W/ SENTINEL NODE BIOPSY: SHX2001

## 2012-08-26 HISTORY — PX: TOTAL MASTECTOMY: SHX6129

## 2012-08-26 HISTORY — PX: MASTECTOMY COMPLETE / SIMPLE: SUR845

## 2012-08-26 HISTORY — DX: Nausea with vomiting, unspecified: R11.2

## 2012-08-26 HISTORY — DX: Nausea with vomiting, unspecified: Z98.890

## 2012-08-26 HISTORY — PX: TISSUE EXPANDER PLACEMENT: SHX2530

## 2012-08-26 SURGERY — MASTECTOMY WITH SENTINEL LYMPH NODE BIOPSY
Anesthesia: General | Site: Breast | Laterality: Right | Wound class: Clean

## 2012-08-26 MED ORDER — ROCURONIUM BROMIDE 100 MG/10ML IV SOLN
INTRAVENOUS | Status: DC | PRN
  Administered 2012-08-26: 10 mg via INTRAVENOUS
  Administered 2012-08-26: 40 mg via INTRAVENOUS

## 2012-08-26 MED ORDER — NEOSTIGMINE METHYLSULFATE 1 MG/ML IJ SOLN
INTRAMUSCULAR | Status: DC | PRN
  Administered 2012-08-26: 4 mg via INTRAVENOUS

## 2012-08-26 MED ORDER — GLYCOPYRROLATE 0.2 MG/ML IJ SOLN
INTRAMUSCULAR | Status: DC | PRN
  Administered 2012-08-26: 0.6 mg via INTRAVENOUS

## 2012-08-26 MED ORDER — METHOCARBAMOL 500 MG PO TABS: 500.0000 mg | ORAL_TABLET | Freq: Four times a day (QID) | ORAL | Status: DC | PRN

## 2012-08-26 MED ORDER — PROMETHAZINE HCL 25 MG/ML IJ SOLN
6.2500 mg | INTRAMUSCULAR | Status: DC | PRN
Start: 2012-08-26 — End: 2012-08-27

## 2012-08-26 MED ORDER — 0.9 % SODIUM CHLORIDE (POUR BTL) OPTIME
TOPICAL | Status: DC | PRN
  Administered 2012-08-26 (×3): 1000 mL

## 2012-08-26 MED ORDER — ALBUMIN HUMAN 5 % IV SOLN
INTRAVENOUS | Status: DC | PRN
  Administered 2012-08-26 (×2): via INTRAVENOUS

## 2012-08-26 MED ORDER — MIDAZOLAM HCL 2 MG/2ML IJ SOLN
INTRAMUSCULAR | Status: AC
  Filled 2012-08-26: qty 2

## 2012-08-26 MED ORDER — ONDANSETRON HCL 4 MG/2ML IJ SOLN
4.0000 mg | Freq: Four times a day (QID) | INTRAMUSCULAR | Status: DC | PRN
  Filled 2012-08-26: qty 2

## 2012-08-26 MED ORDER — ZOLPIDEM TARTRATE 5 MG PO TABS: 5.0000 mg | ORAL_TABLET | Freq: Every evening | ORAL | Status: DC | PRN

## 2012-08-26 MED ORDER — MIDAZOLAM HCL 5 MG/5ML IJ SOLN
INTRAMUSCULAR | Status: DC | PRN
  Administered 2012-08-26: 2 mg via INTRAVENOUS

## 2012-08-26 MED ORDER — PROPOFOL 10 MG/ML IV BOLUS
INTRAVENOUS | Status: DC | PRN
  Administered 2012-08-26: 150 mg via INTRAVENOUS

## 2012-08-26 MED ORDER — FENTANYL CITRATE 0.05 MG/ML IJ SOLN
100.0000 ug | Freq: Once | INTRAMUSCULAR | Status: AC
  Administered 2012-08-26: 100 ug via INTRAVENOUS

## 2012-08-26 MED ORDER — DIPHENHYDRAMINE HCL 50 MG/ML IJ SOLN: 12.5000 mg | Freq: Four times a day (QID) | INTRAMUSCULAR | Status: DC | PRN

## 2012-08-26 MED ORDER — DOCUSATE SODIUM 100 MG PO CAPS
100.0000 mg | ORAL_CAPSULE | Freq: Every day | ORAL | Status: DC
  Administered 2012-08-26 – 2012-08-27 (×2): 100 mg via ORAL
  Filled 2012-08-26 (×2): qty 1

## 2012-08-26 MED ORDER — OXYCODONE-ACETAMINOPHEN 5-325 MG PO TABS: 1.0000 | ORAL_TABLET | ORAL | Status: DC | PRN

## 2012-08-26 MED ORDER — HYDROMORPHONE HCL PF 1 MG/ML IJ SOLN: 0.2500 mg | INTRAMUSCULAR | Status: DC | PRN

## 2012-08-26 MED ORDER — FENTANYL CITRATE 0.05 MG/ML IJ SOLN
INTRAMUSCULAR | Status: AC
  Filled 2012-08-26: qty 2

## 2012-08-26 MED ORDER — MIDAZOLAM HCL 2 MG/2ML IJ SOLN
2.0000 mg | Freq: Once | INTRAMUSCULAR | Status: AC
  Administered 2012-08-26: 2 mg via INTRAVENOUS

## 2012-08-26 MED ORDER — NALOXONE HCL 0.4 MG/ML IJ SOLN: 0.4000 mg | INTRAMUSCULAR | Status: DC | PRN

## 2012-08-26 MED ORDER — HYDROMORPHONE HCL PF 1 MG/ML IJ SOLN
0.5000 mg | INTRAMUSCULAR | Status: DC | PRN
  Administered 2012-08-26: 1 mg via INTRAVENOUS
  Filled 2012-08-26: qty 1

## 2012-08-26 MED ORDER — ONDANSETRON HCL 4 MG/2ML IJ SOLN
INTRAMUSCULAR | Status: DC | PRN
  Administered 2012-08-26: 4 mg via INTRAVENOUS

## 2012-08-26 MED ORDER — VANCOMYCIN HCL IN DEXTROSE 1-5 GM/200ML-% IV SOLN
1000.0000 mg | Freq: Two times a day (BID) | INTRAVENOUS | Status: DC
  Administered 2012-08-26 – 2012-08-27 (×2): 1000 mg via INTRAVENOUS
  Filled 2012-08-26 (×3): qty 200

## 2012-08-26 MED ORDER — LIDOCAINE HCL (CARDIAC) 20 MG/ML IV SOLN
INTRAVENOUS | Status: DC | PRN
  Administered 2012-08-26: 30 mg via INTRAVENOUS

## 2012-08-26 MED ORDER — SODIUM CHLORIDE 0.9 % IJ SOLN: 9.0000 mL | INTRAMUSCULAR | Status: DC | PRN

## 2012-08-26 MED ORDER — DEXTROSE-NACL 5-0.45 % IV SOLN
INTRAVENOUS | Status: DC
  Administered 2012-08-26 – 2012-08-27 (×2): via INTRAVENOUS

## 2012-08-26 MED ORDER — PHENYLEPHRINE HCL 10 MG/ML IJ SOLN
INTRAMUSCULAR | Status: DC | PRN
  Administered 2012-08-26 (×2): 40 ug via INTRAVENOUS

## 2012-08-26 MED ORDER — ACETAMINOPHEN 10 MG/ML IV SOLN: 1000.0000 mg | Freq: Once | INTRAVENOUS | Status: DC | PRN

## 2012-08-26 MED ORDER — SODIUM CHLORIDE 0.9 % IR SOLN
Status: DC | PRN
  Administered 2012-08-26 (×2)

## 2012-08-26 MED ORDER — ARTIFICIAL TEARS OP OINT
TOPICAL_OINTMENT | OPHTHALMIC | Status: DC | PRN
  Administered 2012-08-26: 1 via OPHTHALMIC

## 2012-08-26 MED ORDER — DEXAMETHASONE SODIUM PHOSPHATE 4 MG/ML IJ SOLN
INTRAMUSCULAR | Status: DC | PRN
  Administered 2012-08-26: 8 mg via INTRAVENOUS

## 2012-08-26 MED ORDER — LACTATED RINGERS IV SOLN
INTRAVENOUS | Status: DC | PRN
  Administered 2012-08-26 (×3): via INTRAVENOUS

## 2012-08-26 MED ORDER — TECHNETIUM TC 99M SULFUR COLLOID FILTERED
1.0000 | Freq: Once | INTRAVENOUS | Status: AC | PRN
  Administered 2012-08-26: 1 via INTRADERMAL

## 2012-08-26 MED ORDER — HYDROMORPHONE 0.3 MG/ML IV SOLN
INTRAVENOUS | Status: DC
  Administered 2012-08-26: 20:00:00 via INTRAVENOUS
  Administered 2012-08-27: 1 mg via INTRAVENOUS
  Administered 2012-08-27: 0.99 mg via INTRAVENOUS
  Administered 2012-08-27: 3 mg via INTRAVENOUS
  Filled 2012-08-26: qty 25

## 2012-08-26 MED ORDER — ONDANSETRON HCL 4 MG/2ML IJ SOLN: 4.0000 mg | Freq: Once | INTRAMUSCULAR | Status: DC | PRN

## 2012-08-26 MED ORDER — DIPHENHYDRAMINE HCL 12.5 MG/5ML PO ELIX
12.5000 mg | ORAL_SOLUTION | Freq: Four times a day (QID) | ORAL | Status: DC | PRN
  Filled 2012-08-26: qty 5

## 2012-08-26 MED ORDER — FENTANYL CITRATE 0.05 MG/ML IJ SOLN
INTRAMUSCULAR | Status: DC | PRN
  Administered 2012-08-26 (×2): 50 ug via INTRAVENOUS
  Administered 2012-08-26: 25 ug via INTRAVENOUS
  Administered 2012-08-26 (×2): 50 ug via INTRAVENOUS
  Administered 2012-08-26: 25 ug via INTRAVENOUS
  Administered 2012-08-26 (×2): 50 ug via INTRAVENOUS
  Administered 2012-08-26: 150 ug via INTRAVENOUS

## 2012-08-26 MED ORDER — VECURONIUM BROMIDE 10 MG IV SOLR
INTRAVENOUS | Status: DC | PRN
  Administered 2012-08-26: 1 mg via INTRAVENOUS
  Administered 2012-08-26: 2 mg via INTRAVENOUS
  Administered 2012-08-26 (×3): 1 mg via INTRAVENOUS
  Administered 2012-08-26: 2 mg via INTRAVENOUS

## 2012-08-26 SURGICAL SUPPLY — 81 items
ADH SKN CLS APL DERMABOND .7 (GAUZE/BANDAGES/DRESSINGS) ×12
APL SKNCLS STERI-STRIP NONHPOA (GAUZE/BANDAGES/DRESSINGS)
APPLIER CLIP 9.375 MED OPEN (MISCELLANEOUS) ×4
APR CLP MED 9.3 20 MLT OPN (MISCELLANEOUS) ×3
ATCH SMKEVC FLXB CAUT HNDSWH (FILTER) ×3 IMPLANT
BAG DECANTER FOR FLEXI CONT (MISCELLANEOUS) ×10 IMPLANT
BENZOIN TINCTURE PRP APPL 2/3 (GAUZE/BANDAGES/DRESSINGS) ×3 IMPLANT
BINDER BREAST LRG (GAUZE/BANDAGES/DRESSINGS) ×1 IMPLANT
BINDER BREAST XLRG (GAUZE/BANDAGES/DRESSINGS) IMPLANT
BIOPATCH RED 1 DISK 7.0 (GAUZE/BANDAGES/DRESSINGS) ×10 IMPLANT
Breast Tissue Expander; CPX 4, medium height (Breast) ×2 IMPLANT
Breast tissue expander; CPX 4, medium height (Breast) ×3 IMPLANT
CANISTER SUCTION 2500CC (MISCELLANEOUS) ×8 IMPLANT
CHLORAPREP W/TINT 26ML (MISCELLANEOUS) ×4 IMPLANT
CLIP APPLIE 9.375 MED OPEN (MISCELLANEOUS) ×3 IMPLANT
CLOTH BEACON ORANGE TIMEOUT ST (SAFETY) ×8 IMPLANT
CONT SPEC 4OZ CLIKSEAL STRL BL (MISCELLANEOUS) ×6 IMPLANT
COVER PROBE W GEL 5X96 (DRAPES) ×4 IMPLANT
COVER SURGICAL LIGHT HANDLE (MISCELLANEOUS) ×8 IMPLANT
DERMABOND ADVANCED (GAUZE/BANDAGES/DRESSINGS) ×4
DERMABOND ADVANCED .7 DNX12 (GAUZE/BANDAGES/DRESSINGS) ×6 IMPLANT
DRAIN CHANNEL 19F RND (DRAIN) ×14 IMPLANT
DRAPE CHEST BREAST 15X10 FENES (DRAPES) ×3 IMPLANT
DRAPE ORTHO SPLIT 77X108 STRL (DRAPES) ×8
DRAPE PED LAPAROTOMY (DRAPES) ×2 IMPLANT
DRAPE PROXIMA HALF (DRAPES) ×12 IMPLANT
DRAPE SURG 17X23 STRL (DRAPES) ×10 IMPLANT
DRAPE SURG ORHT 6 SPLT 77X108 (DRAPES) ×6 IMPLANT
DRAPE UTILITY 15X26 (DRAPE) ×4 IMPLANT
DRAPE WARM FLUID 44X44 (DRAPE) ×4 IMPLANT
DRSG PAD ABDOMINAL 8X10 ST (GAUZE/BANDAGES/DRESSINGS) ×10 IMPLANT
DRSG TEGADERM 4X4.75 (GAUZE/BANDAGES/DRESSINGS) ×10 IMPLANT
ELECT BLADE 6.5 EXT (BLADE) IMPLANT
ELECT CAUTERY BLADE 6.4 (BLADE) ×8 IMPLANT
ELECT REM PT RETURN 9FT ADLT (ELECTROSURGICAL) ×4
ELECTRODE REM PT RTRN 9FT ADLT (ELECTROSURGICAL) ×6 IMPLANT
EVACUATOR SILICONE 100CC (DRAIN) ×13 IMPLANT
EVACUATOR SMOKE ACCUVAC VALLEY (FILTER) ×1
GLOVE BIO SURGEON STRL SZ7.5 (GLOVE) ×4 IMPLANT
GLOVE BIOGEL M STRL SZ7.5 (GLOVE) ×2 IMPLANT
GLOVE BIOGEL PI IND STRL 7.0 (GLOVE) IMPLANT
GLOVE BIOGEL PI IND STRL 8 (GLOVE) ×10 IMPLANT
GLOVE BIOGEL PI INDICATOR 7.0 (GLOVE) ×2
GLOVE BIOGEL PI INDICATOR 8 (GLOVE) ×4
GLOVE ECLIPSE 8.0 STRL XLNG CF (GLOVE) ×7 IMPLANT
GLOVE SURG SS PI 7.0 STRL IVOR (GLOVE) ×1 IMPLANT
GLOVE SURG SS PI 7.5 STRL IVOR (GLOVE) ×6 IMPLANT
GOWN PREVENTION PLUS XLARGE (GOWN DISPOSABLE) ×4 IMPLANT
GOWN STRL NON-REIN LRG LVL3 (GOWN DISPOSABLE) ×18 IMPLANT
GOWN STRL REIN 2XL LVL4 (GOWN DISPOSABLE) ×2 IMPLANT
KIT BASIN OR (CUSTOM PROCEDURE TRAY) ×8 IMPLANT
KIT ROOM TURNOVER OR (KITS) ×7 IMPLANT
MARKER SKIN DUAL TIP RULER LAB (MISCELLANEOUS) ×4 IMPLANT
NDL 18GX1X1/2 (RX/OR ONLY) (NEEDLE) IMPLANT
NDL HYPO 25GX1X1/2 BEV (NEEDLE) IMPLANT
NEEDLE 18GX1X1/2 (RX/OR ONLY) (NEEDLE) IMPLANT
NEEDLE HYPO 25GX1X1/2 BEV (NEEDLE) IMPLANT
NS IRRIG 1000ML POUR BTL (IV SOLUTION) ×11 IMPLANT
PACK GENERAL/GYN (CUSTOM PROCEDURE TRAY) ×6 IMPLANT
PAD ARMBOARD 7.5X6 YLW CONV (MISCELLANEOUS) ×8 IMPLANT
PREFILTER EVAC NS 1 1/3-3/8IN (MISCELLANEOUS) ×4 IMPLANT
SET ASEPTIC TRANSFER (MISCELLANEOUS) ×2 IMPLANT
SPECIMEN JAR LARGE (MISCELLANEOUS) ×2 IMPLANT
SPECIMEN JAR X LARGE (MISCELLANEOUS) ×4 IMPLANT
SPONGE GAUZE 4X4 12PLY (GAUZE/BANDAGES/DRESSINGS) ×2 IMPLANT
SPONGE LAP 18X18 X RAY DECT (DISPOSABLE) ×1 IMPLANT
STAPLER VISISTAT 35W (STAPLE) ×4 IMPLANT
STRIP CLOSURE SKIN 1/2X4 (GAUZE/BANDAGES/DRESSINGS) ×2 IMPLANT
SUT ETHILON 2 0 FS 18 (SUTURE) ×4 IMPLANT
SUT MNCRL AB 3-0 PS2 18 (SUTURE) ×16 IMPLANT
SUT MON AB 4-0 PC3 18 (SUTURE) ×7 IMPLANT
SUT PDS AB 3-0 SH 27 (SUTURE) IMPLANT
SUT PROLENE 3 0 PS 2 (SUTURE) ×14 IMPLANT
SUT VIC AB 3-0 SH 18 (SUTURE) ×11 IMPLANT
SYR BULB IRRIGATION 50ML (SYRINGE) ×8 IMPLANT
SYR CONTROL 10ML LL (SYRINGE) IMPLANT
TAPE CLOTH SURG 4X10 WHT LF (GAUZE/BANDAGES/DRESSINGS) ×1 IMPLANT
TOWEL OR 17X24 6PK STRL BLUE (TOWEL DISPOSABLE) ×8 IMPLANT
TOWEL OR 17X26 10 PK STRL BLUE (TOWEL DISPOSABLE) ×6 IMPLANT
TRAY FOLEY CATH 14FRSI W/METER (CATHETERS) ×1 IMPLANT
TUBE CONNECTING 12X1/4 (SUCTIONS) ×4 IMPLANT
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
## 2012-08-26 NOTE — Preoperative (Signed)
Beta Blockers   Reason not to administer Beta Blockers:Not Applicable, No BB therapy 

## 2012-08-26 NOTE — Op Note (Signed)
Operative Note  Kristin Spears female 46 y.o. 08/26/2012  PREOPERATIVE DX:    Multifocal invasive left breast cancer  POSTOPERATIVE DX:  Same  PROCEDURE: #1. Left axillary lymphatic mapping #2. Left axillary sentinel lymph node biopsy (2 lymph nodes). #3. Bilateral mastectomies.         Surgeon: Adolph Pollack   Assistants: Gaynelle Adu M.D.  Anesthesia: General endotracheal anesthesia  Indications:   This is a 46 year old female was found to have a suspicious lesion in the left breast on mammogram earlier this year. Image guided biopsy demonstrated invasive left breast cancer. MRI showed a number of satellite lesions in the same breast. Biopsy of one of these was positive also for invasive breast cancer. Genetic testing is negative. Treatment options were discussed with her and she chose bilateral mastectomies with sentinel lymph node biopsy and reconstruction. She presents for that.    Procedure Detail:  She was seen in the holding area. The left  axillary area was marked with my initials. Injection of radioactive material into the left breast was performed.  She was then brought to the operating room placed supine on the operating table and general anesthetic was administered.  Using the neoprobe, lymphatic mapping was performed to the left axilla in an area of increased counts was noted. The chest wall, upper abdominal wall, neck, and bilateral axillary areas as well as proximal arms were sterilely prepped and draped.    Beginning in the right side an elliptical incision was made in the skin and dermis of the right breast to include the nipple complex. Using electrocautery subcutaneous flaps were raised superiorly to the clavicle, medially to the lateral border of the sternum, inferiorly to the anterior rectus sheath, and laterally to the latissimus dorsi muscle. The breast was then dissected free from the pectoralis major muscle and fascia using electrocautery. The medial aspect  of the breast was marked with a suture. It was handed off the field.  The wound was irrigated and bleeding was controlled with electrocautery. Moist warm laparotomy pads are placed in the wound and the skin was then approximated loosely with staples.  Next the left axilla was approached. Using the neoprobe the area of increased counts was marked and a transverse incision was made through the skin and subcutaneous tissue.  Using a neoprobe  I identified an enlarged lymph node with increased counts of approximately 3400.  This lymph node was removed. There is also an adjacent large lymph node that did not have increased counts and this was removed as well. The initial lymph node was labeled sentinel lymph node #1. The neoprobe was placed back into the wound and a second smaller lymph node was noted with increased counts of approximately 480. This was removed as well and labeled as sentinel lymph node 2. No further increase counts were noted in the left axilla.  The wound is inspected and irrigated and hemostasis was adequate. The subcutaneous tissues and closed with interrupted 3-0 Vicryl sutures. The  skin was closed with 4-0 Monocryl subcuticular stitch. Dermabond was applied.  Next the left breast was approached.  An elliptical incision was made through the skin and dermis to include the nipple areolar complex. Subcutaneous flaps were then raised to the clavicle superiorly, to the lateral border sternum medially, to the anterior rectus sheath inferiorly, and to the latissimus muscle laterally. Once  this was done, The breast was dissected free from the pectoralis muscle and fascia using electrocautery. The medial aspect of the breast was  marked with a suture. The breast was sent to pathology. The wound is then irrigated and bleeding points were controlled with electrocautery. Once hemostasis was adequate  warm moist sponges were placed in the wound and the skin was loosely closed using staples.  She  tolerated these procedures well without any apparent complications. Dr. Etter Sjogren then came in to perform the reconstruction which will be dictated in his note.  Estimated Blood Loss:  200 mL        Blood Given: none          Specimens: Right breast tissue, left breast tissue, left axillary lymph nodes.        Complications:  * No complications entered in OR log *         Disposition: PACU - hemodynamically stable.         Condition: stable

## 2012-08-26 NOTE — Transfer of Care (Signed)
Immediate Anesthesia Transfer of Care Note  Patient: Kristin Spears  Procedure(s) Performed: Procedure(s) (LRB) with comments: MASTECTOMY WITH SENTINEL LYMPH NODE BIOPSY (Left) - bilateral mastectomies and left axillary sentinel lymph node biopsy TOTAL MASTECTOMY (Right) - prophylactic TISSUE EXPANDER (Bilateral) - WITH POSSIBLE USE OF HD FLEX  Patient Location: PACU  Anesthesia Type:General  Level of Consciousness: awake, alert  and oriented  Airway & Oxygen Therapy: Patient Spontanous Breathing and Patient connected to nasal cannula oxygen  Post-op Assessment: Report given to PACU RN and Post -op Vital signs reviewed and stable  Post vital signs: Reviewed and stable  Complications: No apparent anesthesia complications

## 2012-08-26 NOTE — Interval H&P Note (Signed)
History and Physical Interval Note:  08/26/2012 9:33 AM  Kristin Spears  has presented today for surgery, with the diagnosis of left breast cancer  The various methods of treatment have been discussed with the patient and family. After consideration of risks, benefits and other options for treatment, the patient has consented to  Procedure(s) (LRB) with comments: MASTECTOMY WITH SENTINEL LYMPH NODE BIOPSY (Left) - bilateral mastectomies and left axillary sentinel lymph node biopsy TOTAL MASTECTOMY (Right) TISSUE EXPANDER (Bilateral) - WITH POSSIBLE USE OF HD FLEX as a surgical intervention .  The patient's history has been reviewed, patient examined, no change in status, stable for surgery.  I have reviewed the patient's chart and labs.  Questions were answered to the patient's satisfaction.     Shams Fill Shela Commons

## 2012-08-26 NOTE — Anesthesia Postprocedure Evaluation (Signed)
  Anesthesia Post-op Note  Patient: Kristin Spears  Procedure(s) Performed: Procedure(s) (LRB) with comments: MASTECTOMY WITH SENTINEL LYMPH NODE BIOPSY (Left) - bilateral mastectomies and left axillary sentinel lymph node biopsy TOTAL MASTECTOMY (Right) - prophylactic TISSUE EXPANDER (Bilateral) - WITH POSSIBLE USE OF HD FLEX  Patient Location: PACU  Anesthesia Type:General  Level of Consciousness: awake, alert , oriented and patient cooperative  Airway and Oxygen Therapy: Patient Spontanous Breathing  Post-op Pain: mild  Post-op Assessment: Post-op Vital signs reviewed, Patient's Cardiovascular Status Stable, Respiratory Function Stable, Patent Airway, No signs of Nausea or vomiting and Pain level controlled  Post-op Vital Signs: Reviewed and stable  Complications: No apparent anesthesia complications

## 2012-08-26 NOTE — Anesthesia Preprocedure Evaluation (Addendum)
Anesthesia Evaluation  Patient identified by MRN, date of birth, ID band Patient awake    Reviewed: Allergy & Precautions, H&P , NPO status , Patient's Chart, lab work & pertinent test results  Airway Mallampati: II      Dental  (+) Teeth Intact and Dental Advisory Given   Pulmonary  breath sounds clear to auscultation        Cardiovascular Rhythm:Regular Rate:Normal     Neuro/Psych    GI/Hepatic   Endo/Other    Renal/GU      Musculoskeletal   Abdominal   Peds  Hematology   Anesthesia Other Findings   Reproductive/Obstetrics                           Anesthesia Physical Anesthesia Plan  ASA: II  Anesthesia Plan: General   Post-op Pain Management:    Induction: Intravenous  Airway Management Planned: Oral ETT  Additional Equipment:   Intra-op Plan:   Post-operative Plan: Extubation in OR  Informed Consent: I have reviewed the patients History and Physical, chart, labs and discussed the procedure including the risks, benefits and alternatives for the proposed anesthesia with the patient or authorized representative who has indicated his/her understanding and acceptance.   Dental advisory given  Plan Discussed with: CRNA and Surgeon  Anesthesia Plan Comments: (L. Breast Ca H/O migraines H/O syncope  Plan GA with oral ETT)       Anesthesia Quick Evaluation

## 2012-08-26 NOTE — Brief Op Note (Signed)
08/26/2012  2:42 PM  PATIENT:  Kristin Spears  46 y.o. female  PRE-OPERATIVE DIAGNOSIS:  left breast cancer  POST-OPERATIVE DIAGNOSIS:  * No post-op diagnosis entered *  PROCEDURE:  Procedure(s) (LRB) with comments: MASTECTOMY WITH SENTINEL LYMPH NODE BIOPSY (Left) - bilateral mastectomies and left axillary sentinel lymph node biopsy TOTAL MASTECTOMY (Right) - prophylactic TISSUE EXPANDER (Bilateral) - WITH POSSIBLE USE OF HD FLEX  SURGEON:  Surgeon(s) and Role: Panel 1:    * Adolph Pollack, MD - Primary    * Atilano Ina, MD,FACS - Assisting  Panel 2:    * Etter Sjogren, MD - Primary  PHYSICIAN ASSISTANT:   ASSISTANTS: none   ANESTHESIA:   general  EBL:  Total I/O In: 3500 [I.V.:3000; IV Piggyback:500] Out: 420 [Urine:220; Blood:200]  BLOOD ADMINISTERED:none  DRAINS: (4) Jackson-Pratt drain(s) with closed bulb suction in the left (2) and right (2) mastectomy sites   LOCAL MEDICATIONS USED:  NONE  SPECIMEN:  No Specimen  DISPOSITION OF SPECIMEN:  N/A  COUNTS:  YES  TOURNIQUET:  * No tourniquets in log *  DICTATION: .Other Dictation: Dictation Number [id number]  PLAN OF CARE: Admit to inpatient   PATIENT DISPOSITION:  PACU - hemodynamically stable.   Delay start of Pharmacological VTE agent (>24hrs) due to surgical blood loss or risk of bleeding: yes
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
## 2012-08-27 MED ORDER — HYDROCODONE-ACETAMINOPHEN 5-325 MG PO TABS: 1.0000 | ORAL_TABLET | ORAL | Status: DC | PRN

## 2012-08-27 MED ORDER — METHOCARBAMOL 500 MG PO TABS: 500.0000 mg | ORAL_TABLET | Freq: Four times a day (QID) | ORAL | Status: DC

## 2012-08-27 MED ORDER — HYDROMORPHONE HCL 2 MG PO TABS: 2.0000 mg | ORAL_TABLET | ORAL | Status: DC | PRN

## 2012-08-27 MED ORDER — DSS 100 MG PO CAPS: 100.0000 mg | ORAL_CAPSULE | Freq: Two times a day (BID) | ORAL | Status: DC

## 2012-08-27 MED ORDER — ENOXAPARIN SODIUM 40 MG/0.4ML ~~LOC~~ SOLN: 40.0000 mg | SUBCUTANEOUS | Status: DC

## 2012-08-27 MED ORDER — CLINDAMYCIN HCL 300 MG PO CAPS
300.0000 mg | ORAL_CAPSULE | Freq: Two times a day (BID) | ORAL | Status: DC
  Administered 2012-08-27: 300 mg via ORAL
  Filled 2012-08-27 (×2): qty 1

## 2012-08-27 MED ORDER — HYDROMORPHONE HCL 2 MG PO TABS
2.0000 mg | ORAL_TABLET | ORAL | Status: DC | PRN
  Administered 2012-08-27 (×2): 4 mg via ORAL
  Filled 2012-08-27 (×2): qty 2

## 2012-08-27 MED ORDER — ENOXAPARIN SODIUM 40 MG/0.4ML ~~LOC~~ SOLN
40.0000 mg | SUBCUTANEOUS | Status: DC
  Administered 2012-08-27: 40 mg via SUBCUTANEOUS
  Filled 2012-08-27: qty 0.4

## 2012-08-27 MED ORDER — ENOXAPARIN (LOVENOX) PATIENT EDUCATION KIT
PACK | Freq: Once | Status: DC
  Filled 2012-08-27: qty 1

## 2012-08-27 MED ORDER — CLINDAMYCIN HCL 300 MG PO CAPS: 300.0000 mg | ORAL_CAPSULE | Freq: Two times a day (BID) | ORAL | Status: DC

## 2012-08-27 MED ORDER — METHOCARBAMOL 500 MG PO TABS
500.0000 mg | ORAL_TABLET | Freq: Four times a day (QID) | ORAL | Status: DC
  Administered 2012-08-27: 500 mg via ORAL
  Filled 2012-08-27: qty 1

## 2012-08-27 NOTE — Discharge Summary (Signed)
Physician Discharge Summary  Patient ID: Kristin Spears MRN: [id number] DOB/AGE: 46-02-1966 46 y.o.  Admit date: 08/26/2012 Discharge date: 08/27/2012  Admission Diagnoses:Breast cancer Discharge Diagnoses: Same Active Problems:  * No active hospital problems. *    Discharged Condition: good  Hospital Course: On the day of admission the patient was taken to surgery and had bilateral mastectomy and reconstruction with tissue excpanders. She also had left sentinel node.. The patient tolerated the procedures well. Postoperatively, the mastectomy flaps maintained excellent color and capillary refill. The patient was ambulatory and tolerating diet on the first postoperative day. No nausea.  Treatments: antibiotics: vancomycin, anticoagulation: LMW heparin and surgery: bilateral mastectomy, left sentinel node, bilateral reconstruction with tissue expanders.  Discharge Exam: Blood pressure 110/55, pulse 76, temperature 100 F (37.8 C), temperature source Oral, resp. rate 17, height 5\' 6"  (1.676 m), weight 143 lb 1.6 oz (64.91 kg), last menstrual period 08/16/2012, SpO2 98.00%.  Operative sites: Mastectomy flaps viable. No evidence of vascular compromise. Tissue expanders in good position. Drains functioning. Drainage thin. No evidence of bleeding or infection either side.  Disposition: Discharge home later today Return to office Wednesday.  Discharge Orders    Future Appointments: Provider: Department: Dept Phone: Center:   09/22/2012 12:00 PM [name] [facility] MEDICAL ONCOLOGY [phone] None   09/22/2012 12:30 PM Pierce Crane, MD  [facility] [phone] None       Medication List     As of 08/27/2012 11:19 AM    STOP taking these medications         cetirizine 10 MG tablet   Commonly known as: ZYRTEC      MUCINEX D PO      multivitamin per tablet      OMEGA 3 PO      vitamin E 400 UNIT capsule      TAKE these  medications         clindamycin 300 MG capsule   Commonly known as: CLEOCIN   Take 1 capsule (300 mg total) by mouth 2 (two) times daily at 10 AM and 5 PM.      DSS 100 MG Caps   Take 100 mg by mouth 2 (two) times daily.      enoxaparin 40 MG/0.4ML injection   Commonly known as: LOVENOX   Inject 0.4 mLs (40 mg total) into the skin daily.      methocarbamol 500 MG tablet   Commonly known as: ROBAXIN   Take 1 tablet (500 mg total) by mouth 4 (four) times daily.      nystatin-triamcinolone cream   Commonly known as: MYCOLOG II   Apply 1 application topically 2 (two) times daily.      VITAMIN D PO   Take 1 tablet by mouth daily.      zaleplon 10 MG capsule   Commonly known as: SONATA   Take 10 mg by mouth at bedtime.         SignedOdis Luster, Saquan Furtick M 08/27/2012, 11:19 AM

## 2012-08-27 NOTE — Op Note (Signed)
NAMESHAQUETA, CODER NO.:  [id number]  MEDICAL RECORD NO.:  [id number]  LOCATION:  6N30C                        FACILITY:  [facility]  PHYSICIAN:  Etter Sjogren, M.D.     DATE OF BIRTH:  1966/07/23  DATE OF PROCEDURE:  08/26/2012 DATE OF DISCHARGE:                              OPERATIVE REPORT   PREOPERATIVE DIAGNOSIS:  Breast cancer.  POSTOPERATIVE DIAGNOSIS:  Breast cancer.Marland Kitchen  PROCEDURE PERFORMED:  Bilateral breast reconstruction with tissue expanders.  SURGEON:  Etter Sjogren, M.D.  ANESTHESIA:  General.  ESTIMATED BLOOD LOSS:  20 mL.  DRAINS:  Two 19-French on each side.  CLINICAL NOTE:  A 46 year old woman who has breast cancer and is having bilateral mastectomy and desire reconstruction.  Options discussed and she selected placement of tissue expanders, immediate reconstruction as a planned stage procedure with eventual removal of expanders and placement of implant.  The nature of these procedures and risks plus complications were discussed with her in detail.  These risks included, but are not limited to, bleeding, infection, healing problems, fluid accumulations, scarring, anesthesia complications, loss of sensation, loss of skin of the mastectomy flaps, failure of device, capsular contracture, displacement device, wrinkles, ripples, pneumothorax, pulmonary embolism, chronic pain, disappointment, and contour deformities around the periphery of the reconstruction.  She understood all of this and wished to proceed.  DESCRIPTION OF PROCEDURE:  The patient was taken to the operating room. Bilateral mastectomies were performed.  She tolerated that well.  The mastectomy flaps were inspected and found to be in excellent condition. Excellent vascularity and looked very healthy.  The spaces were irrigated thoroughly with saline and antibiotic solution also placed. Submuscular space was then developed using electrocautery at the lateral border of the  pectoralis major muscle with a small portion of this radius anterior as well.  Inferiorly, the dissection was carried out to the inframammary crease.  The space was developed in a submuscular plane to the dimensions of the planned tissue expanders.  This plan for 550 mL tissue expanders.  Irrigation with saline and antibiotic solution. Meticulous hemostasis with electrocautery and the tissue expanders were then prepared after thoroughly cleaning gloves.  These expanders were mentor 550 mL moderate height expanders.  100 mL sterile saline placed using a closed filling system.  All the air removed.  The expanders were soaked in antibiotic solution for greater than 5 minutes.  The space was checked, excellent hemostasis confirmed.  The expanders were positioned. Care was taken to make sure there were oriented properly and antibiotic solution again placed and then the closure of the muscle with 3-0 Vicryl interrupted figure-of-eight sutures.  Two 19-French drains positioned on each side, brought out through separate stab wounds inferolaterally. These were tunneled as far as possible in order to prevent infection. The skin closure with 3-0 Monocryl inverted deep dermal sutures and 4-0 Monocryl running subcuticular suture as needed.  Dermabond applied.  Biopatch with Tegaderm was applied over the drains.  Dry sterile dressings and the chest vest applied.  She was transferred to recovery stable having tolerated procedure well.  She will be admitted to the hospital for overnight observation.     Etter Sjogren, M.D.  DB/MEDQ  D:  08/26/2012  T:  08/27/2012  Job:  [id number]

## 2012-08-27 NOTE — Progress Notes (Signed)
General Surgery Note  LOS: 1 day  POD -  1 Day Post-Op  Assessment/Plan: 1.  Bilateral MASTECTOMies WITH Left SENTINEL LYMPH NODE BIOPSY,  Bilateral TISSUE EXPANDER - Rosenbower/Bowers - 08/26/2012  Left invasive ductal carcinoma  Doing well.  To advance diet.  Start vicodin for pain (she has trouble with codeine, but she is unsure of vicodin)  Discharge plans per Dr. Odis Luster.  2. Still has foley - to remove  Subjective:  Chest feels tight, like she did a lot of exercises, but not bad pain.  Her father is in the room with her. Objective:   Filed Vitals:   08/27/12 0631  BP: 115/66  Pulse: 77  Temp: 98.9 F (37.2 C)  Resp: 18     Intake/Output from previous day:  11/22 0701 - 11/23 0700 In: 3790 [P.O.:290; I.V.:3000; IV Piggyback:500] Out: 4637 [Urine:4020; Drains:417; Blood:200]  Intake/Output this shift:      Physical Exam:   General: WN WF who is alert and oriented.    HEENT: Normal. Pupils equal. .   Lungs: Clear   Breast Wound: Dressings look okay.  2 drains on each side with sero-sanguinous fluid.  Most recorded fluid was drain #3 - 205 cc last 24 hours.     Lab Results:   No results found for this basename: WBC:2,HGB:2,HCT:2,PLT:2 in the last 72 hours  BMET  No results found for this basename: NA:2,K:2,CL:2,CO2:2,GLUCOSE:2,BUN:2,CREATININE:2,CALCIUM:2 in the last 72 hours  PT/INR  No results found for this basename: LABPROT:2,INR:2 in the last 72 hours  ABG  No results found for this basename: PHART:2,PCO2:2,PO2:2,HCO3:2 in the last 72 hours   Studies/Results:  Nm Sentinel Node Inj-no Rpt (breast)  08/26/2012  CLINICAL DATA: left breast cancer   Sulfur colloid was injected intradermally by the nuclear medicine  technologist for breast cancer sentinel node localization.       Anti-infectives:   Anti-infectives     Start     Dose/Rate Route Frequency Ordered Stop   08/26/12 2200   vancomycin (VANCOCIN) IVPB 1000 mg/200 mL premix        1,000 mg 200 mL/hr  over 60 Minutes Intravenous Every 12 hours 08/26/12 1748     08/26/12 1155   polymyxin B 500,000 Units, bacitracin 50,000 Units in sodium chloride irrigation 0.9 % 500 mL irrigation  Status:  Discontinued          As needed 08/26/12 1205 08/26/12 1427   08/26/12 0600   vancomycin (VANCOCIN) IVPB 1000 mg/200 mL premix        1,000 mg 200 mL/hr over 60 Minutes Intravenous On call to O.R. 08/25/12 1508 08/26/12 0959          Ovidio Kin, MD, FACS Pager: [phone],   [facility] Office: [phone] 08/27/2012

## 2012-08-30 ENCOUNTER — Encounter (HOSPITAL_COMMUNITY): Payer: Self-pay | Admitting: General Surgery

## 2012-08-31 ENCOUNTER — Encounter: Payer: Self-pay | Admitting: *Deleted

## 2012-08-31 ENCOUNTER — Encounter (INDEPENDENT_AMBULATORY_CARE_PROVIDER_SITE_OTHER): Payer: Self-pay | Admitting: General Surgery

## 2012-08-31 NOTE — Progress Notes (Unsigned)
Patient ID: Kristin Spears, female   DOB: 03/16/1966, 46 y.o.   MRN: [id number] Her husband called with questions.  We discussed these and got them answered.

## 2012-08-31 NOTE — Progress Notes (Signed)
Ordered Oncotype Dx test w/ [facility].  Faxed request to [facility].  Faxed PAC to [employer]. 

## 2012-08-31 NOTE — Progress Notes (Unsigned)
Patient ID: Kristin Spears, female   DOB: 05-21-1966, 46 y.o.   MRN: [id number] Pathology demonstrates a T1cN1a lesion with 1 out of 4 lymph nodes positive.   Spoke with her about this and the guideline for an axillary dissection (procedure and risks explained).  Spoke about option of XRT instead of surgery, but explained to her that this was not the usual course.  Told her to think about it and call Monday with her decision.  I spoke with Dr. Odis Luster and he would like to get the drains out before she has another operation.

## 2012-09-05 ENCOUNTER — Other Ambulatory Visit (INDEPENDENT_AMBULATORY_CARE_PROVIDER_SITE_OTHER): Payer: Self-pay | Admitting: General Surgery

## 2012-09-05 ENCOUNTER — Telehealth (INDEPENDENT_AMBULATORY_CARE_PROVIDER_SITE_OTHER): Payer: Self-pay

## 2012-09-05 ENCOUNTER — Encounter (INDEPENDENT_AMBULATORY_CARE_PROVIDER_SITE_OTHER): Payer: Self-pay | Admitting: General Surgery

## 2012-09-05 NOTE — Telephone Encounter (Signed)
Pt and her husband came to the office today to let Dr. Abbey Chatters know that Dr. Odis Luster had removed her drains and she would like to schedule surgery for sometime next week after 09/13/12.

## 2012-09-05 NOTE — Progress Notes (Unsigned)
Patient ID: AMARISE LILLO, female   DOB: January 27, 1966, 46 y.o.   MRN: [id number] The drains have been removed. She wants to proceed with the axillary dissection. We'll get this scheduled.

## 2012-09-07 NOTE — Telephone Encounter (Signed)
LMOV pt's po appt will be scheduled after surgery per Dr. Abbey Chatters.

## 2012-09-08 NOTE — Progress Notes (Signed)
To come in for [facility] labs Bring all meds and overnight bag

## 2012-09-09 ENCOUNTER — Encounter (HOSPITAL_BASED_OUTPATIENT_CLINIC_OR_DEPARTMENT_OTHER)
Admission: RE | Admit: 2012-09-09 | Discharge: 2012-09-09 | Disposition: A | Discharge status: Home / Self Care | Payer: BC Managed Care – PPO | Source: Ambulatory Visit | Attending: General Surgery | Admitting: General Surgery

## 2012-09-09 LAB — CBC WITH DIFFERENTIAL/PLATELET
Basophils Relative: 0 % (ref 0–1)
Eosinophils Absolute: 0.3 10*3/uL (ref 0.0–0.7)
MCH: 32.6 pg (ref 26.0–34.0)
MCHC: 33.8 g/dL (ref 30.0–36.0)
Neutrophils Relative %: 75 % (ref 43–77)
Platelets: 166 10*3/uL (ref 150–400)
RDW: 12.7 % (ref 11.5–15.5)

## 2012-09-09 LAB — BASIC METABOLIC PANEL
BUN: 20 mg/dL (ref 6–23)
CO2: 28 mEq/L (ref 19–32)
Calcium: 9.1 mg/dL (ref 8.4–10.5)
Chloride: 101 mEq/L (ref 96–112)
Creatinine, Ser: 0.98 mg/dL (ref 0.50–1.10)
Glucose, Bld: 90 mg/dL (ref 70–99)

## 2012-09-12 ENCOUNTER — Ambulatory Visit (HOSPITAL_BASED_OUTPATIENT_CLINIC_OR_DEPARTMENT_OTHER)
Admission: RE | Admit: 2012-09-12 | Discharge: 2012-09-13 | Disposition: A | Discharge status: Home / Self Care | Payer: BC Managed Care – PPO | Source: Ambulatory Visit | Attending: General Surgery | Admitting: General Surgery

## 2012-09-12 ENCOUNTER — Encounter (HOSPITAL_BASED_OUTPATIENT_CLINIC_OR_DEPARTMENT_OTHER): Payer: Self-pay | Admitting: Anesthesiology

## 2012-09-12 ENCOUNTER — Encounter (HOSPITAL_BASED_OUTPATIENT_CLINIC_OR_DEPARTMENT_OTHER): Payer: Self-pay | Admitting: *Deleted

## 2012-09-12 ENCOUNTER — Encounter (HOSPITAL_BASED_OUTPATIENT_CLINIC_OR_DEPARTMENT_OTHER)
Admission: RE | Disposition: A | Discharge status: Home / Self Care | Payer: Self-pay | Source: Ambulatory Visit | Attending: General Surgery

## 2012-09-12 ENCOUNTER — Ambulatory Visit (HOSPITAL_BASED_OUTPATIENT_CLINIC_OR_DEPARTMENT_OTHER): Payer: BC Managed Care – PPO | Admitting: Anesthesiology

## 2012-09-12 DIAGNOSIS — Z8249 Family history of ischemic heart disease and other diseases of the circulatory system: Secondary | ICD-10-CM | POA: Insufficient documentation

## 2012-09-12 DIAGNOSIS — Z885 Allergy status to narcotic agent status: Secondary | ICD-10-CM | POA: Insufficient documentation

## 2012-09-12 DIAGNOSIS — Z803 Family history of malignant neoplasm of breast: Secondary | ICD-10-CM | POA: Insufficient documentation

## 2012-09-12 DIAGNOSIS — Z808 Family history of malignant neoplasm of other organs or systems: Secondary | ICD-10-CM | POA: Insufficient documentation

## 2012-09-12 DIAGNOSIS — C773 Secondary and unspecified malignant neoplasm of axilla and upper limb lymph nodes: Secondary | ICD-10-CM | POA: Insufficient documentation

## 2012-09-12 DIAGNOSIS — Z882 Allergy status to sulfonamides status: Secondary | ICD-10-CM | POA: Insufficient documentation

## 2012-09-12 DIAGNOSIS — F909 Attention-deficit hyperactivity disorder, unspecified type: Secondary | ICD-10-CM | POA: Insufficient documentation

## 2012-09-12 DIAGNOSIS — C50919 Malignant neoplasm of unspecified site of unspecified female breast: Secondary | ICD-10-CM

## 2012-09-12 DIAGNOSIS — Z880 Allergy status to penicillin: Secondary | ICD-10-CM | POA: Insufficient documentation

## 2012-09-12 DIAGNOSIS — Z9010 Acquired absence of unspecified breast and nipple: Secondary | ICD-10-CM | POA: Insufficient documentation

## 2012-09-12 HISTORY — PX: AXILLARY LYMPH NODE DISSECTION: SHX5229

## 2012-09-12 HISTORY — PX: OTHER SURGICAL HISTORY: SHX169

## 2012-09-12 SURGERY — LYMPHADENECTOMY, AXILLARY
Anesthesia: General | Site: Axilla | Laterality: Left | Wound class: Clean

## 2012-09-12 MED ORDER — VANCOMYCIN HCL IN DEXTROSE 1-5 GM/200ML-% IV SOLN
1000.0000 mg | INTRAVENOUS | Status: AC
  Administered 2012-09-12: 1000 mg via INTRAVENOUS

## 2012-09-12 MED ORDER — HYDROMORPHONE HCL PF 1 MG/ML IJ SOLN: 0.5000 mg | INTRAMUSCULAR | Status: DC | PRN

## 2012-09-12 MED ORDER — FENTANYL CITRATE 0.05 MG/ML IJ SOLN
INTRAMUSCULAR | Status: DC | PRN
  Administered 2012-09-12: 25 ug via INTRAVENOUS
  Administered 2012-09-12: 100 ug via INTRAVENOUS
  Administered 2012-09-12: 25 ug via INTRAVENOUS

## 2012-09-12 MED ORDER — DEXAMETHASONE SODIUM PHOSPHATE 4 MG/ML IJ SOLN
INTRAMUSCULAR | Status: DC | PRN
  Administered 2012-09-12: 10 mg via INTRAVENOUS

## 2012-09-12 MED ORDER — MIDAZOLAM HCL 2 MG/2ML IJ SOLN: 1.0000 mg | INTRAMUSCULAR | Status: DC | PRN

## 2012-09-12 MED ORDER — KCL IN DEXTROSE-NACL 20-5-0.9 MEQ/L-%-% IV SOLN: INTRAVENOUS | Status: DC

## 2012-09-12 MED ORDER — HYDROMORPHONE HCL PF 1 MG/ML IJ SOLN
0.2500 mg | INTRAMUSCULAR | Status: DC | PRN
  Administered 2012-09-12 (×3): 0.5 mg via INTRAVENOUS

## 2012-09-12 MED ORDER — LIDOCAINE HCL (CARDIAC) 20 MG/ML IV SOLN
INTRAVENOUS | Status: DC | PRN
  Administered 2012-09-12: 40 mg via INTRAVENOUS

## 2012-09-12 MED ORDER — PROPOFOL 10 MG/ML IV BOLUS
INTRAVENOUS | Status: DC | PRN
  Administered 2012-09-12: 170 mg via INTRAVENOUS

## 2012-09-12 MED ORDER — OXYCODONE HCL 5 MG/5ML PO SOLN: 5.0000 mg | Freq: Once | ORAL | Status: DC | PRN

## 2012-09-12 MED ORDER — DEXTROSE IN LACTATED RINGERS 5 % IV SOLN
INTRAVENOUS | Status: DC
  Administered 2012-09-12: 50 mL/h via INTRAVENOUS

## 2012-09-12 MED ORDER — ONDANSETRON HCL 4 MG PO TABS: 4.0000 mg | ORAL_TABLET | Freq: Four times a day (QID) | ORAL | Status: DC | PRN

## 2012-09-12 MED ORDER — DOCUSATE SODIUM 100 MG PO CAPS
100.0000 mg | ORAL_CAPSULE | Freq: Two times a day (BID) | ORAL | Status: DC
  Administered 2012-09-12 (×2): 100 mg via ORAL

## 2012-09-12 MED ORDER — VANCOMYCIN HCL IN DEXTROSE 1-5 GM/200ML-% IV SOLN
1000.0000 mg | INTRAVENOUS | Status: AC
  Administered 2012-09-13: 1000 mg via INTRAVENOUS

## 2012-09-12 MED ORDER — MIDAZOLAM HCL 5 MG/5ML IJ SOLN
INTRAMUSCULAR | Status: DC | PRN
  Administered 2012-09-12: 2 mg via INTRAVENOUS

## 2012-09-12 MED ORDER — PROMETHAZINE HCL 25 MG/ML IJ SOLN: 6.2500 mg | INTRAMUSCULAR | Status: DC | PRN

## 2012-09-12 MED ORDER — ZOLPIDEM TARTRATE 5 MG PO TABS: 5.0000 mg | ORAL_TABLET | Freq: Every evening | ORAL | Status: DC | PRN

## 2012-09-12 MED ORDER — ONDANSETRON HCL 4 MG/2ML IJ SOLN: 4.0000 mg | Freq: Four times a day (QID) | INTRAMUSCULAR | Status: DC | PRN

## 2012-09-12 MED ORDER — LACTATED RINGERS IV SOLN
INTRAVENOUS | Status: DC
  Administered 2012-09-12: 10:00:00 via INTRAVENOUS

## 2012-09-12 MED ORDER — OXYCODONE HCL 5 MG PO TABS: 5.0000 mg | ORAL_TABLET | Freq: Once | ORAL | Status: DC | PRN

## 2012-09-12 MED ORDER — FENTANYL CITRATE 0.05 MG/ML IJ SOLN: 50.0000 ug | INTRAMUSCULAR | Status: DC | PRN

## 2012-09-12 MED ORDER — MEPERIDINE HCL 25 MG/ML IJ SOLN: 6.2500 mg | INTRAMUSCULAR | Status: DC | PRN

## 2012-09-12 MED ORDER — BUPIVACAINE-EPINEPHRINE PF 0.5-1:200000 % IJ SOLN
INTRAMUSCULAR | Status: DC | PRN
  Administered 2012-09-12: 8 mL

## 2012-09-12 MED ORDER — METHOCARBAMOL 500 MG PO TABS
500.0000 mg | ORAL_TABLET | Freq: Four times a day (QID) | ORAL | Status: DC
  Administered 2012-09-12: 500 mg via ORAL

## 2012-09-12 MED ORDER — METHOCARBAMOL 500 MG PO TABS
500.0000 mg | ORAL_TABLET | Freq: Four times a day (QID) | ORAL | Status: DC
  Administered 2012-09-12 – 2012-09-13 (×2): 500 mg via ORAL

## 2012-09-12 MED ORDER — ONDANSETRON HCL 4 MG/2ML IJ SOLN
INTRAMUSCULAR | Status: DC | PRN
  Administered 2012-09-12: 4 mg via INTRAVENOUS

## 2012-09-12 MED ORDER — HYDROMORPHONE HCL 2 MG PO TABS
2.0000 mg | ORAL_TABLET | ORAL | Status: DC | PRN
  Administered 2012-09-12 – 2012-09-13 (×5): 4 mg via ORAL

## 2012-09-12 SURGICAL SUPPLY — 56 items
ADH SKN CLS APL DERMABOND .7 (GAUZE/BANDAGES/DRESSINGS) ×1
APL SKNCLS STERI-STRIP NONHPOA (GAUZE/BANDAGES/DRESSINGS)
APPLIER CLIP 11 MED OPEN (CLIP)
APPLIER CLIP 9.375 MED OPEN (MISCELLANEOUS) ×4
APR CLP MED 11 20 MLT OPN (CLIP)
APR CLP MED 9.3 20 MLT OPN (MISCELLANEOUS) ×2
BENZOIN TINCTURE PRP APPL 2/3 (GAUZE/BANDAGES/DRESSINGS) IMPLANT
BLADE SURG 15 STRL LF DISP TIS (BLADE) ×2 IMPLANT
BLADE SURG 15 STRL SS (BLADE) ×4
CANISTER SUCTION 1200CC (MISCELLANEOUS) ×2 IMPLANT
CHLORAPREP W/TINT 26ML (MISCELLANEOUS) ×2 IMPLANT
CLIP APPLIE 11 MED OPEN (CLIP) IMPLANT
CLIP APPLIE 9.375 MED OPEN (MISCELLANEOUS) IMPLANT
CLIP TI MEDIUM 6 (CLIP) IMPLANT
CLIP TI WIDE RED SMALL 6 (CLIP) IMPLANT
CLOTH BEACON ORANGE TIMEOUT ST (SAFETY) ×2 IMPLANT
COVER MAYO STAND STRL (DRAPES) ×2 IMPLANT
COVER TABLE BACK 60X90 (DRAPES) ×2 IMPLANT
DECANTER SPIKE VIAL GLASS SM (MISCELLANEOUS) IMPLANT
DERMABOND ADVANCED (GAUZE/BANDAGES/DRESSINGS) ×1
DERMABOND ADVANCED .7 DNX12 (GAUZE/BANDAGES/DRESSINGS) IMPLANT
DRAIN CHANNEL 19F RND (DRAIN) ×1 IMPLANT
DRAPE LAPAROTOMY TRNSV 102X78 (DRAPE) ×2 IMPLANT
DRAPE UTILITY XL STRL (DRAPES) ×2 IMPLANT
ELECT COATED BLADE 2.86 ST (ELECTRODE) ×2 IMPLANT
ELECT REM PT RETURN 9FT ADLT (ELECTROSURGICAL) ×2
ELECTRODE REM PT RTRN 9FT ADLT (ELECTROSURGICAL) ×1 IMPLANT
EVACUATOR SILICONE 100CC (DRAIN) ×1 IMPLANT
GLOVE BIOGEL PI IND STRL 8.5 (GLOVE) ×1 IMPLANT
GLOVE BIOGEL PI INDICATOR 8.5 (GLOVE) ×1
GLOVE ECLIPSE 8.0 STRL XLNG CF (GLOVE) ×2 IMPLANT
GOWN PREVENTION PLUS XLARGE (GOWN DISPOSABLE) IMPLANT
NDL HYPO 25X1 1.5 SAFETY (NEEDLE) ×2 IMPLANT
NDL SAFETY ECLIPSE 18X1.5 (NEEDLE) ×1 IMPLANT
NEEDLE HYPO 18GX1.5 SHARP (NEEDLE) ×2
NEEDLE HYPO 25X1 1.5 SAFETY (NEEDLE) ×2 IMPLANT
NS IRRIG 1000ML POUR BTL (IV SOLUTION) ×2 IMPLANT
PACK BASIN DAY SURGERY FS (CUSTOM PROCEDURE TRAY) ×2 IMPLANT
PENCIL BUTTON HOLSTER BLD 10FT (ELECTRODE) ×2 IMPLANT
PIN SAFETY STERILE (MISCELLANEOUS) IMPLANT
SLEEVE SCD COMPRESS KNEE MED (MISCELLANEOUS) ×2 IMPLANT
SPONGE INTESTINAL PEANUT (DISPOSABLE) IMPLANT
SPONGE LAP 4X18 X RAY DECT (DISPOSABLE) ×4 IMPLANT
STAPLER VISISTAT 35W (STAPLE) ×2 IMPLANT
STRIP CLOSURE SKIN 1/2X4 (GAUZE/BANDAGES/DRESSINGS) IMPLANT
SUT ETHILON 3 0 FSL (SUTURE) ×2 IMPLANT
SUT MON AB 4-0 PC3 18 (SUTURE) ×2 IMPLANT
SUT VIC AB 4-0 BRD 54 (SUTURE) IMPLANT
SUT VICRYL 3-0 CR8 SH (SUTURE) ×2 IMPLANT
SYR CONTROL 10ML LL (SYRINGE) ×3 IMPLANT
TAPE HYPAFIX 4 X10 (GAUZE/BANDAGES/DRESSINGS) ×2 IMPLANT
TOWEL OR 17X24 6PK STRL BLUE (TOWEL DISPOSABLE) ×4 IMPLANT
TOWEL OR NON WOVEN STRL DISP B (DISPOSABLE) ×2 IMPLANT
TUBE CONNECTING 20X1/4 (TUBING) ×2 IMPLANT
WATER STERILE IRR 1000ML POUR (IV SOLUTION) ×2 IMPLANT
YANKAUER SUCT BULB TIP NO VENT (SUCTIONS) ×2 IMPLANT

## 2012-09-12 NOTE — Interval H&P Note (Signed)
History and Physical Interval Note:  09/12/2012 11:06 AM  Kristin Spears  has presented today for surgery, with the diagnosis of left breast cancer  The various methods of treatment have been discussed with the patient and family. After consideration of risks, benefits and other options for treatment, the patient has consented to  Procedure(s) (LRB) with comments: AXILLARY LYMPH NODE DISSECTION (Left) as a surgical intervention .  The patient's history has been reviewed, patient examined, no change in status, stable for surgery.  I have reviewed the patient's chart and labs.  Questions were answered to the patient's satisfaction.     Shed Nixon Shela Commons
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
## 2012-09-12 NOTE — Op Note (Signed)
Operative Note  Kristin Spears female 46 y.o. 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 1 the sentinel lymph node was positive. She presents for left axillary lymph node dissection.    Procedure Detail:  She was seen in the holding area in the left axillary area was marked my initials. She was brought to the operating room placed supine on the operating table and a general anesthetic was given. The left axillary area were sterilely prepped and draped.  The previous incision was opened up sharply at the skin level and subcutaneous tissue level by dividing the sutures. I dissected through the scar down to the chest wall. I then identified the left axillary vein. I began dissecting lymphatic tissue from the inferior aspect of the vein and clipping small branches of the vein and lymphatics. The long thoracic nerve and thoracodorsal nerves were identified. The lymphatic tissues between these nerves was dissected free from the chest wall and axillary area leaving the nerves intact and functional. Palpation in that region demonstrated no suspicious lymph nodes after the dissection was performed. This tissue was sent to pathology.  The wound is irrigated and hemostasis was adequate. Surgicel was placed in the wound bed. A stab incision was made in the inferior flap area and a 19 Blake drain was placed into the wound and anchored to the skin with nylon suture.  The subcutaneous tissue was then approximated with interrupted 3-0 Vicryl sutures. The skin was closed with running 4-0 Monocryl subcuticular stitch. Dermabond and sterile  dressings were applied. The drain was hooked to closed suction.  She tolerated the procedure well without any apparent complications and was taken to the recovery room in satisfactory condition.  Estimated Blood Loss:  less than 100 mL         Drains: JACKSON-PRATT (JP)  Blood Given: none          Specimens: Left axillary lymphatic contents        Complications:  * No complications entered in OR log *         Disposition: PACU - hemodynamically stable.         Condition: stable

## 2012-09-12 NOTE — Anesthesia Postprocedure Evaluation (Signed)
  Anesthesia Post-op Note  Patient: Kristin Spears  Procedure(s) Performed: Procedure(s) (LRB) with comments: AXILLARY LYMPH NODE DISSECTION (Left)  Patient Location: PACU  Anesthesia Type:General  Level of Consciousness: awake  Airway and Oxygen Therapy: Patient Spontanous Breathing  Post-op Pain: mild  Post-op Assessment: Post-op Vital signs reviewed  Post-op Vital Signs: stable  Complications: No apparent anesthesia complications

## 2012-09-12 NOTE — Transfer of Care (Signed)
Immediate Anesthesia Transfer of Care Note  Patient: Kristin Spears  Procedure(s) Performed: Procedure(s) (LRB) with comments: AXILLARY LYMPH NODE DISSECTION (Left)  Patient Location: PACU  Anesthesia Type:General  Level of Consciousness: awake, alert  and oriented  Airway & Oxygen Therapy: Patient Spontanous Breathing and Patient connected to face mask oxygen  Post-op Assessment: Report given to PACU RN and Post -op Vital signs reviewed and stable  Post vital signs: Reviewed and stable  Complications: No apparent anesthesia complications

## 2012-09-12 NOTE — Anesthesia Procedure Notes (Signed)
Procedure Name: LMA Insertion Date/Time: 09/12/2012 11:17 AM Performed by: Burna Cash Pre-anesthesia Checklist: Patient identified, Emergency Drugs available, Suction available and Patient being monitored Patient Re-evaluated:Patient Re-evaluated prior to inductionOxygen Delivery Method: Circle System Utilized Preoxygenation: Pre-oxygenation with 100% oxygen Intubation Type: IV induction Ventilation: Mask ventilation without difficulty LMA: LMA inserted LMA Size: 4.0 Number of attempts: 1 Airway Equipment and Method: bite block Placement Confirmation: positive ETCO2 Tube secured with: Tape Dental Injury: Teeth and Oropharynx as per pre-operative assessment

## 2012-09-12 NOTE — Anesthesia Preprocedure Evaluation (Signed)
Anesthesia Evaluation  Patient identified by MRN, date of birth, ID band Patient awake    Reviewed: Allergy & Precautions  History of Anesthesia Complications (+) PONV  Airway Mallampati: I  Neck ROM: Full    Dental  (+) Teeth Intact   Pulmonary  breath sounds clear to auscultation        Cardiovascular negative cardio ROS  Rhythm:Regular Rate:Normal     Neuro/Psych  Headaches, Anxiety Hx of cognitive dysfunction in past after anesthesia    GI/Hepatic negative GI ROS, Neg liver ROS,   Endo/Other  negative endocrine ROS  Renal/GU negative Renal ROS     Musculoskeletal negative musculoskeletal ROS (+)   Abdominal   Peds  Hematology negative hematology ROS (+)   Anesthesia Other Findings   Reproductive/Obstetrics                           Anesthesia Physical Anesthesia Plan  ASA: I  Anesthesia Plan: General   Post-op Pain Management:    Induction:   Airway Management Planned: LMA  Additional Equipment:   Intra-op Plan:   Post-operative Plan:   Informed Consent: I have reviewed the patients History and Physical, chart, labs and discussed the procedure including the risks, benefits and alternatives for the proposed anesthesia with the patient or authorized representative who has indicated his/her understanding and acceptance.   Dental advisory given  Plan Discussed with: CRNA and Surgeon  Anesthesia Plan Comments:         Anesthesia Quick Evaluation

## 2012-09-13 ENCOUNTER — Encounter (HOSPITAL_BASED_OUTPATIENT_CLINIC_OR_DEPARTMENT_OTHER): Payer: Self-pay | Admitting: General Surgery

## 2012-09-13 NOTE — Progress Notes (Signed)
1 Day Post-Op  Subjective: Some left shoulder discomfort.  Objective: Vital signs in last 24 hours: Temp:  [97.9 F (36.6 C)-98.5 F (36.9 C)] 98.5 F (36.9 C) (12/10 0330) Pulse Rate:  [61-79] 61  (12/10 0330) Resp:  [12-19] 18  (12/10 0330) BP: (88-140)/(48-81) 125/68 mmHg (12/10 0330) SpO2:  [95 %-100 %] 99 % (12/10 0330) Weight:  [143 lb (64.864 kg)] 143 lb (64.864 kg) (12/09 0944)    Intake/Output from previous day: 12/09 0701 - 12/10 0700 In: 3329.2 [P.O.:2310; I.V.:1019.2] Out: 3275 [Urine:3200; Drains:75] Intake/Output this shift:    PE: General- In NAD  Left axillary dressing dry, minimal drain output  Lab Results:  No results found for this basename: WBC:2,HGB:2,HCT:2,PLT:2 in the last 72 hours BMET No results found for this basename: NA:2,K:2,CL:2,CO2:2,GLUCOSE:2,BUN:2,CREATININE:2,CALCIUM:2 in the last 72 hours PT/INR No results found for this basename: LABPROT:2,INR:2 in the last 72 hours Comprehensive Metabolic Panel:    Component Value Date/Time   NA 137 09/09/2012 1005   NA 139 07/06/2012 0828   K 4.6 09/09/2012 1005   K 3.8 07/06/2012 0828   CL 101 09/09/2012 1005   CL 105 07/06/2012 0828   CO2 28 09/09/2012 1005   CO2 25 07/06/2012 0828   BUN 20 09/09/2012 1005   BUN 15.0 07/06/2012 0828   CREATININE 0.98 09/09/2012 1005   CREATININE 0.8 07/06/2012 0828   GLUCOSE 90 09/09/2012 1005   GLUCOSE 87 07/06/2012 0828   CALCIUM 9.1 09/09/2012 1005   CALCIUM 9.0 07/06/2012 0828   AST 17 08/19/2012 1334   AST 16 07/06/2012 0828   ALT 10 08/19/2012 1334   ALT 11 07/06/2012 0828   ALKPHOS 76 08/19/2012 1334   ALKPHOS 69 07/06/2012 0828   BILITOT 0.4 08/19/2012 1334   BILITOT 0.50 07/06/2012 0828   PROT 7.5 08/19/2012 1334   PROT 7.1 07/06/2012 0828   ALBUMIN 4.0 08/19/2012 1334   ALBUMIN 3.8 07/06/2012 0828     Studies/Results: No results found.  Anti-infectives: Anti-infectives     Start     Dose/Rate Route Frequency Ordered Stop   09/13/12 0600   vancomycin  (VANCOCIN) IVPB 1000 mg/200 mL premix        1,000 mg 200 mL/hr over 60 Minutes Intravenous On call to O.R. 09/12/12 1438 09/14/12 0559   09/12/12 0948   vancomycin (VANCOCIN) IVPB 1000 mg/200 mL premix        1,000 mg 200 mL/hr over 60 Minutes Intravenous On call to O.R. 09/12/12 0948 09/12/12 1113          Assessment Stable overnight.  Receiving Vancomycin this AM.    LOS: 1 day   Plan: Discharge.  Instructions given.   Dimitry Holsworth J 09/13/2012

## 2012-09-15 ENCOUNTER — Telehealth (INDEPENDENT_AMBULATORY_CARE_PROVIDER_SITE_OTHER): Payer: Self-pay

## 2012-09-15 NOTE — Telephone Encounter (Signed)
Pt made aware of new pathology results of benign lymph nodes.

## 2012-09-19 ENCOUNTER — Telehealth (INDEPENDENT_AMBULATORY_CARE_PROVIDER_SITE_OTHER): Payer: Self-pay | Admitting: General Surgery

## 2012-09-19 NOTE — Telephone Encounter (Signed)
Patient called today and stated that she is having swelling in her arm now it just started last night ( Sunday ). She stated it is hurting some in her arm now. I asked her if she is running a fever and if her arm is red she statement was no. I looked and the pt has an apt tomorrow 09-20-12 with Dr Abbey Chatters at 1:45 I told her that she will have some pain after having surgery, I told her that the nerves are waking up. I also told her that if she stated running a fever and her arm getting red to call us and we will let Dr Abbey Chatters know

## 2012-09-20 ENCOUNTER — Encounter (INDEPENDENT_AMBULATORY_CARE_PROVIDER_SITE_OTHER): Payer: Self-pay | Admitting: General Surgery

## 2012-09-20 ENCOUNTER — Ambulatory Visit (INDEPENDENT_AMBULATORY_CARE_PROVIDER_SITE_OTHER): Payer: BC Managed Care – PPO | Admitting: General Surgery

## 2012-09-20 VITALS — BP 118/62 | HR 68 | Temp 97.9°F | Resp 18 | Ht 66.0 in | Wt 138.0 lb

## 2012-09-20 DIAGNOSIS — Z9889 Other specified postprocedural states: Secondary | ICD-10-CM

## 2012-09-20 NOTE — Patient Instructions (Signed)
Do your arm exercises as instructed.  Pathology  T1cN1a, Stage IIA.

## 2012-09-20 NOTE — Progress Notes (Signed)
Procedure: 1. Bilateral mastectomies with tissue expander placements and left axillary sentinel lymph node biopsy 08/26/2012. 2. Left axillary lymph node dissection 09/12/2012.  Pathology: T1cN1a  History:  She is here for a postoperative visit after the above procedures. She's been having some discomfort in her left arm which is not unexpected. Drain output is less than 30 cc a day.  Exam: General- Is in NAD. Chest wall-bilateral incisions are clean and intact with tissue expanders in.  Left axillary incision is clean and intact. Drain was removed and dressing was applied.  Assessment:  T1cN1a left breast cancer-Wounds healing well.  Plan:  Referred to after breast cancer exercise class. I did teach her some exercises well. Return visit 3 weeks.

## 2012-09-21 ENCOUNTER — Other Ambulatory Visit: Payer: Self-pay | Admitting: *Deleted

## 2012-09-22 ENCOUNTER — Ambulatory Visit (HOSPITAL_BASED_OUTPATIENT_CLINIC_OR_DEPARTMENT_OTHER): Payer: BC Managed Care – PPO | Admitting: Oncology

## 2012-09-22 ENCOUNTER — Telehealth: Payer: Self-pay | Admitting: *Deleted

## 2012-09-22 ENCOUNTER — Other Ambulatory Visit: Payer: BC Managed Care – PPO | Admitting: Lab

## 2012-09-22 VITALS — BP 92/60 | HR 66 | Temp 98.2°F | Resp 20 | Ht 66.0 in | Wt 138.5 lb

## 2012-09-22 DIAGNOSIS — C50219 Malignant neoplasm of upper-inner quadrant of unspecified female breast: Secondary | ICD-10-CM

## 2012-09-22 DIAGNOSIS — Z170 Estrogen receptor positive status [ER+]: Secondary | ICD-10-CM

## 2012-09-22 MED ORDER — TAMOXIFEN CITRATE 20 MG PO TABS: 20.0000 mg | ORAL_TABLET | Freq: Every day | ORAL | Status: DC

## 2012-09-22 NOTE — Progress Notes (Signed)
Hematology and Oncology Follow Up Visit  Kristin Spears [id number] 08/19/1966 46 y.o. 09/22/2012 6:48 PM   DIAGNOSIS:   Encounter Diagnosis  Name Primary?  . Cancer of upper-inner quadrant of female breast Yes  Diagnosis 1. Breast, simple mastectomy, right 11/ 22 2013- BENIGN BREAST TISSUE WITH FIBROCYSTIC CHANGES AND FIBROADENOMATOID CHANGES; NO ATYPIA OR MALIGNANCY. - INKED RESECTION MARGINS, NEGATIVE FOR ATYPIA OR MALIGNANCY. 2. Lymph node, sentinel, biopsy, left axillary #1 - ONE LYMPH NODE, POSITIVE FOR METASTATIC CARCINOMA (1/1). 3. Lymph node, sentinel, biopsy, left - ONE BENIGN LYMPH NODE, NEGATIVE FOR METASTATIC CARCINOMA (0/1). 4. Lymph node, sentinel, biopsy, left axillary #2 - ONE LYMPH NODE, NEGATIVE FOR METASTATIC CARCINOMA (0/1). 5. Lymph nodes, regional resection, left axillary contents - ONE LYMPH NODE, NEGATIVE FOR METASTATIC CARCINOMA (0/1). 6. Breast, simple mastectomy, left - TWO FOCI OF INVASIVE LOBULAR CARCINOMA, 1.5 CM AND 1.1 CM, NOTTINGHAM COMBINED HISTOLOGIC GRADE I. - ADJACENT LOBULAR CARCINOMA IN SITU. - INKED RESECTION MARGINS, NEGATIVE FOR ATYPIA OR MALIGNANCY Subsequent lymph node dissection on left side 4 negative lymph nodes.  PAST THERAPY:  As above she has had bilateral mastectomies and is recovering. She is subsequent lymph node dissection. Tissue was submitted for Oncotype testing which came back with a score of 4 corresponding to a risk of recurrence of 5% she initially saw Dr. Dayton Scrape for radiation oncology and has not seen him since.  Interim History:  We discussed the implications of the Oncotype score. I am fairly comfortable that she does not need chemotherapy despite having a positive lymph node given the lobular morphology. I do think she needs to speak to radiation oncology regarding possible chest wall radiation. She did have expanders placed in visit need to be adjusted accordingly if she were to have radiation.  Medications: I have  reviewed the patient's current medications.  Allergies:  Allergies  Allergen Reactions  . Codeine Nausea And Vomiting  . Morphine And Related Nausea And Vomiting  . Other Rash    "lemon grass"  . Rose Hips (Ascorbate) Rash  . Malt     congestion  . Probiotic (Acidophilus)     Nose itch  . Banana Rash  . Claritin (Loratadine) Rash and Other (See Comments)    Watery rash on hands  . Naldecon Senior (Guaifenesin) Rash and Other (See Comments)    Congestion  . Oat Other (See Comments)    "I get congested after 1 oatmeal cookie"  . Penicillins Other (See Comments)    Since she was a baby  . Seldane (Terfenadine) Other (See Comments)    Congests her  . Sulfa Antibiotics Rash and Other (See Comments)    Became congested    Past Medical History, Surgical history, Social history, and Family History were reviewed and updated.  Review of Systems: Constitutional:  Negative for fever, chills, night sweats, anorexia, weight loss, pain. Cardiovascular: no chest pain or dyspnea on exertion Respiratory: no cough, shortness of breath, or wheezing Neurological: no TIA or stroke symptoms Dermatological: negative ENT: negative Skin Gastrointestinal: no abdominal pain, change in bowel habits, or black or bloody stools Genito-Urinary: no dysuria, trouble voiding, or hematuria Hematological and Lymphatic: negative Breast: negative for breast lumps Musculoskeletal: negative Remaining ROS negative.  Physical Exam:  Blood pressure 92/60, pulse 66, temperature 98.2 F (36.8 C), temperature source Oral, resp. rate 20, height 5\' 6"  (1.676 m), weight 138 lb 8 oz (62.823 kg), last menstrual period 09/11/2012.  ECOG: 0   HEENT:  Sclerae anicteric, conjunctivae pink.  Oropharynx clear.  No mucositis or candidiasis.  Nodes:  No cervical, supraclavicular, or axillary lymphadenopathy palpated.  Breast Exam:   Right and left breasts are status post bilateral mastectomy. Expanders are in place. Skin  is healing well. There is no axillary adenopathy.  Lungs:  Clear to auscultation bilaterally.  No crackles, rhonchi, or wheezes.  Heart:  Regular rate and rhythm.  Abdomen:  Soft, nontender.  Positive bowel sounds.  No organomegaly or masses palpated.  Musculoskeletal:  No focal spinal tenderness to palpation.  Extremities:  Benign.  No peripheral edema or cyanosis.  Skin:  Benign.  Neuro:  Nonfocal.    Lab Results: Lab Results  Component Value Date   WBC 9.2 09/09/2012   HGB 13.7 09/09/2012   HCT 40.5 09/09/2012   MCV 96.4 09/09/2012   PLT 166 09/09/2012     Chemistry      Component Value Date/Time   NA 137 09/09/2012 1005   NA 139 07/06/2012 0828   K 4.6 09/09/2012 1005   K 3.8 07/06/2012 0828   CL 101 09/09/2012 1005   CL 105 07/06/2012 0828   CO2 28 09/09/2012 1005   CO2 25 07/06/2012 0828   BUN 20 09/09/2012 1005   BUN 15.0 07/06/2012 0828   CREATININE 0.98 09/09/2012 1005   CREATININE 0.8 07/06/2012 0828      Component Value Date/Time   CALCIUM 9.1 09/09/2012 1005   CALCIUM 9.0 07/06/2012 0828   ALKPHOS 76 08/19/2012 1334   ALKPHOS 69 07/06/2012 0828   AST 17 08/19/2012 1334   AST 16 07/06/2012 0828   ALT 10 08/19/2012 1334   ALT 11 07/06/2012 0828   BILITOT 0.4 08/19/2012 1334   BILITOT 0.50 07/06/2012 0828             IMPRESSIONS AND PLAN: A 46 y.o. female with   History of multifocal lobular cancer with node positive status. She is strongly ER/PR positive. She certainly does not need adjuvant chemotherapy. I have suggested that she take tamoxifen describe side effects. She is premenopausal. I've also recommended that she see the radiation oncologist. I will plan serial followup in 3 months time.  Spent more than half the time coordinating care, as well as discussion of BMI and its implications.      Cherye Gaertner 12/19/20136:48 PM Cell [phone]

## 2012-09-22 NOTE — Telephone Encounter (Signed)
/  u dr Dayton Scrape re xrt

## 2012-09-27 ENCOUNTER — Other Ambulatory Visit: Payer: Self-pay | Admitting: *Deleted

## 2012-09-27 DIAGNOSIS — C50219 Malignant neoplasm of upper-inner quadrant of unspecified female breast: Secondary | ICD-10-CM

## 2012-09-30 ENCOUNTER — Encounter: Payer: Self-pay | Admitting: Radiation Oncology

## 2012-09-30 NOTE — Progress Notes (Signed)
New Consult Left Breast Cancer 08/26/12  Right  And Left mastectomy ER/PR=positive, with tissue expander placements,left sentinel bx 1 sentinel lumph node on right positive;, left axillary lymph node dissection 09/12/12 Oncotype score=4  1st  Biopsy 06/28/12=Left breast, 12 o'clock= Invasive and in Situ Mammary Carcinoma    Married, 1 son, alert,oriented x3, no c/o pain, incisions well healed bi lateral  breast areas , not taking Tamoxifen yet    Allergies: Claritin,Guaifenesin,rose hips,sulfa =rash, Pcn since a baby, Malt, Banana, Oat Codeine,Morphine(n,v)

## 2012-10-04 ENCOUNTER — Ambulatory Visit
Admission: RE | Admit: 2012-10-04 | Discharge: 2012-10-04 | Disposition: A | Discharge status: Home / Self Care | Payer: BC Managed Care – PPO | Source: Ambulatory Visit | Attending: Radiation Oncology | Admitting: Radiation Oncology

## 2012-10-04 ENCOUNTER — Encounter: Payer: Self-pay | Admitting: Radiation Oncology

## 2012-10-04 ENCOUNTER — Ambulatory Visit: Payer: BC Managed Care – PPO | Admitting: Radiation Oncology

## 2012-10-04 ENCOUNTER — Ambulatory Visit: Payer: BC Managed Care – PPO

## 2012-10-04 VITALS — BP 124/81 | HR 77 | Temp 97.9°F | Resp 20 | Ht 66.0 in | Wt 142.0 lb

## 2012-10-04 DIAGNOSIS — C50219 Malignant neoplasm of upper-inner quadrant of unspecified female breast: Secondary | ICD-10-CM

## 2012-10-04 HISTORY — DX: Allergy, unspecified, initial encounter: T78.40XA

## 2012-10-04 HISTORY — DX: Other specified disorders of bladder: N32.89

## 2012-10-04 NOTE — Progress Notes (Signed)
Please see the Nurse Progress Note in the MD Initial Consult Encounter for this patient. 

## 2012-10-04 NOTE — Progress Notes (Signed)
CC: Dr. Abbey Chatters, Dr. Donnie Coffin, Dr. Odis Luster   Followup note:  Diagnosis: Pathologic stage IIA  (T1, N1, M0) invasive lobular carcinoma of the left breast  History:  Kristin Spears is seen today for followup of her invasive lobular carcinoma of the left breast. I saw her at the [facility] on 07/06/2012. She was found have multicentric invasive lobular carcinoma left breast and underwent bilateral mastectomy and  a sentinel lymph node biopsy. She'll also had immediate construction with placement of tissue expanders by Dr. Odis Luster. On review of her pathology she was then had a 1.5 cm invasive lobular carcinoma at 12:00 in a 1.1 cm invasive lobular carcinoma at 9:00. Surgical margins were greater than 1 cm. One of 4 lymph nodes contain metastatic disease. He was strongly ER/PR positive and HER-2/neu negative. Ki-67 was 17% and 20%. Dr. Abbey Chatters completion axillary dissection on 09/12/2012 with an additional 4 lymph nodes all free of metastatic disease. Thus a total of one of 8 lymph nodes contained metastatic disease. Her Oncotype DX score was 4 indicating little benefit of adjuvant chemotherapy. She was seen by Dr. Donnie Coffin who has started her on adjuvant tamoxifen. She is without complaints today.  Physical examination: Alert and oriented 46 year old white female appearing her stated age. Wt Readings from Last 3 Encounters:  10/04/12 142 lb (64.411 kg)  09/22/12 138 lb 8 oz (62.823 kg)  09/20/12 138 lb (62.596 kg)   Temp Readings from Last 3 Encounters:  10/04/12 97.9 F (36.6 C)   09/22/12 98.2 F (36.8 C) Oral  09/20/12 97.9 F (36.6 C) Oral   BP Readings from Last 3 Encounters:  10/04/12 124/81  09/22/12 92/60  09/20/12 118/62   Pulse Readings from Last 3 Encounters:  10/04/12 77  09/22/12 66  09/20/12 68   Head and neck examination: Grossly unremarkable. Nodes: Without palpable cervical, supraclavicular, or axillary lymphadenopathy. Chest: Lungs clear. Breasts: Bilateral mastectomies with tissue  expanders. Mastectomy wounds are healing well. Abdomen: Without hepatomegaly. Extremities: Without edema.  Impression: Stage IIA (T1, N1, M0) invasive lobular carcinoma of the left breast. We discussed the role of post mastectomy radiation therapy . Post mastectomy radiation therapy is generally recommended in the setting of 4 more positive lymph nodes or greater than 20-25% lymph node positivity, T3 disease, close or involved surgical margins, and in some cases of triple negative cancers. Her risk for local regional failure is less than 5-10%, and thus I do not feel that she would benefit from post mastectomy radiation therapy. Based on the [facility] nomogram there is only a 5% risk for additional nodal involvement.  Plan: She'll maintain her followup with Dr. Abbey Chatters, Dr. Odis Luster and medical oncology. She will start adjuvant tamoxifen.  30 minutes was spent face-to-face with the patient, primarily counseling the patient.

## 2012-10-08 ENCOUNTER — Encounter: Payer: Self-pay | Admitting: Oncology

## 2012-10-18 ENCOUNTER — Ambulatory Visit (INDEPENDENT_AMBULATORY_CARE_PROVIDER_SITE_OTHER): Payer: BC Managed Care – PPO | Admitting: General Surgery

## 2012-10-18 ENCOUNTER — Encounter (INDEPENDENT_AMBULATORY_CARE_PROVIDER_SITE_OTHER): Payer: Self-pay | Admitting: General Surgery

## 2012-10-18 VITALS — BP 112/60 | HR 72 | Temp 98.5°F | Resp 12 | Ht 66.0 in | Wt 139.8 lb

## 2012-10-18 DIAGNOSIS — Z9889 Other specified postprocedural states: Secondary | ICD-10-CM

## 2012-10-18 NOTE — Progress Notes (Signed)
Procedure: 1. Bilateral mastectomies with tissue expander placements and left axillary sentinel lymph node biopsy 08/26/2012. 2. Left axillary lymph node dissection 09/12/2012.  Pathology: T1cN1a  History:  She is here for another postoperative visit after the above procedures.  She is doing well. She is doing well with the tissue expanders. She has good range of motion of her arms. Exam: General- Is in NAD. Chest wall-bilateral incisions are clean and intact with tissue expanders in.  Left axillary incision is clean and intact.  LUE-no lymphedema  Assessment:  T1cN1a left breast cancer-doing well postop  Plan:  Return visit in 3 months.

## 2012-10-18 NOTE — Patient Instructions (Signed)
Call if you feel any firm nodules in the chest wall

## 2012-10-30 ENCOUNTER — Other Ambulatory Visit: Payer: Self-pay | Admitting: *Deleted

## 2012-10-30 DIAGNOSIS — C50219 Malignant neoplasm of upper-inner quadrant of unspecified female breast: Secondary | ICD-10-CM

## 2012-11-02 ENCOUNTER — Telehealth: Payer: Self-pay | Admitting: Genetic Counselor

## 2012-11-02 NOTE — Telephone Encounter (Signed)
Left good news message on VM and asked that she call back. 

## 2012-11-09 ENCOUNTER — Telehealth: Payer: Self-pay | Admitting: Genetic Counselor

## 2012-11-09 ENCOUNTER — Encounter: Payer: Self-pay | Admitting: Genetic Counselor

## 2012-11-09 NOTE — Telephone Encounter (Signed)
Error

## 2012-12-16 ENCOUNTER — Encounter: Payer: Self-pay | Admitting: Oncology

## 2012-12-16 ENCOUNTER — Other Ambulatory Visit (HOSPITAL_BASED_OUTPATIENT_CLINIC_OR_DEPARTMENT_OTHER): Payer: BC Managed Care – PPO | Admitting: Lab

## 2012-12-16 ENCOUNTER — Telehealth: Payer: Self-pay | Admitting: Oncology

## 2012-12-16 ENCOUNTER — Ambulatory Visit (HOSPITAL_BASED_OUTPATIENT_CLINIC_OR_DEPARTMENT_OTHER): Payer: BC Managed Care – PPO | Admitting: Oncology

## 2012-12-16 VITALS — BP 102/68 | HR 69 | Temp 98.6°F | Resp 20 | Ht 66.0 in | Wt 142.3 lb

## 2012-12-16 DIAGNOSIS — C50219 Malignant neoplasm of upper-inner quadrant of unspecified female breast: Secondary | ICD-10-CM

## 2012-12-16 DIAGNOSIS — C50212 Malignant neoplasm of upper-inner quadrant of left female breast: Secondary | ICD-10-CM

## 2012-12-16 DIAGNOSIS — C50919 Malignant neoplasm of unspecified site of unspecified female breast: Secondary | ICD-10-CM

## 2012-12-16 DIAGNOSIS — C50912 Malignant neoplasm of unspecified site of left female breast: Secondary | ICD-10-CM

## 2012-12-16 DIAGNOSIS — Z170 Estrogen receptor positive status [ER+]: Secondary | ICD-10-CM

## 2012-12-16 LAB — COMPREHENSIVE METABOLIC PANEL (CC13)
ALT: 11 U/L (ref 0–55)
BUN: 15.9 mg/dL (ref 7.0–26.0)
CO2: 25 mEq/L (ref 22–29)
Creatinine: 0.8 mg/dL (ref 0.6–1.1)
Glucose: 82 mg/dl (ref 70–99)
Total Bilirubin: 0.68 mg/dL (ref 0.20–1.20)

## 2012-12-16 LAB — CBC WITH DIFFERENTIAL/PLATELET
BASO%: 0.3 % (ref 0.0–2.0)
Basophils Absolute: 0 10*3/uL (ref 0.0–0.1)
HCT: 40.6 % (ref 34.8–46.6)
LYMPH%: 20.1 % (ref 14.0–49.7)
MCHC: 33.8 g/dL (ref 31.5–36.0)
MONO#: 0.4 10*3/uL (ref 0.1–0.9)
NEUT%: 73.6 % (ref 38.4–76.8)
Platelets: 185 10*3/uL (ref 145–400)
WBC: 9.3 10*3/uL (ref 3.9–10.3)

## 2012-12-16 MED ORDER — TAMOXIFEN CITRATE 20 MG PO TABS: 20.0000 mg | ORAL_TABLET | Freq: Every day | ORAL | Status: DC

## 2012-12-16 NOTE — Patient Instructions (Addendum)
Continue the tamoxifen daily  I will see you back in 6 months

## 2012-12-16 NOTE — Telephone Encounter (Signed)
gv pt appt schedule for September.  °
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
## 2013-01-23 ENCOUNTER — Encounter (INDEPENDENT_AMBULATORY_CARE_PROVIDER_SITE_OTHER): Payer: BC Managed Care – PPO | Admitting: General Surgery

## 2013-03-14 ENCOUNTER — Encounter: Payer: Self-pay | Admitting: *Deleted

## 2013-03-14 NOTE — Progress Notes (Signed)
Faxed Office notes to [place] at [facility] for Medical Necessity.

## 2013-04-21 ENCOUNTER — Encounter (INDEPENDENT_AMBULATORY_CARE_PROVIDER_SITE_OTHER): Payer: Self-pay | Admitting: General Surgery

## 2013-04-21 ENCOUNTER — Ambulatory Visit (INDEPENDENT_AMBULATORY_CARE_PROVIDER_SITE_OTHER): Payer: BC Managed Care – PPO | Admitting: General Surgery

## 2013-04-21 VITALS — Ht 66.0 in | Wt 141.6 lb

## 2013-04-21 DIAGNOSIS — Z853 Personal history of malignant neoplasm of breast: Secondary | ICD-10-CM

## 2013-04-21 NOTE — Progress Notes (Signed)
Procedure: 1. Bilateral mastectomies with tissue expander placements and left axillary sentinel lymph node biopsy 08/26/2012. 2. Left axillary lymph node dissection 09/12/2012.  Pathology: T1cN1a  History:  She is here for a long-term follow up visit.  She had her implants placed in April. She is doing well from this. She feels little tightness when she raises her arms above her head. She denies any nodules on her chest wall or lymphadenopathy.  She is on tamoxifen and is tolerating it. Exam: General- Is in NAD. Chest wall-Bilateral scars are present. No nodules in the skin the subcutaneous tissue. Implants are present.  Lymph nodes-no palpable cervical, subclavicular, or axillary adenopathy the  Assessment:  T1cN1a left breast cancer-No clinical evidence of recurrence.  Plan:  Return visit in 3 months.

## 2013-04-21 NOTE — Patient Instructions (Addendum)
Call if you feel any hard nodules in your chest wall skin.

## 2013-04-25 ENCOUNTER — Other Ambulatory Visit: Payer: Self-pay | Admitting: Dermatology

## 2013-06-09 ENCOUNTER — Encounter: Payer: Self-pay | Admitting: Adult Health

## 2013-06-09 ENCOUNTER — Telehealth: Payer: Self-pay | Admitting: Oncology

## 2013-06-09 ENCOUNTER — Other Ambulatory Visit (HOSPITAL_BASED_OUTPATIENT_CLINIC_OR_DEPARTMENT_OTHER): Payer: BC Managed Care – PPO | Admitting: Lab

## 2013-06-09 ENCOUNTER — Ambulatory Visit (HOSPITAL_BASED_OUTPATIENT_CLINIC_OR_DEPARTMENT_OTHER): Payer: BC Managed Care – PPO | Admitting: Adult Health

## 2013-06-09 VITALS — BP 105/69 | HR 71 | Temp 98.5°F | Resp 18 | Ht 66.0 in | Wt 144.7 lb

## 2013-06-09 DIAGNOSIS — C50212 Malignant neoplasm of upper-inner quadrant of left female breast: Secondary | ICD-10-CM

## 2013-06-09 DIAGNOSIS — C50219 Malignant neoplasm of upper-inner quadrant of unspecified female breast: Secondary | ICD-10-CM

## 2013-06-09 DIAGNOSIS — C50919 Malignant neoplasm of unspecified site of unspecified female breast: Secondary | ICD-10-CM

## 2013-06-09 LAB — COMPREHENSIVE METABOLIC PANEL (CC13)
AST: 24 U/L (ref 5–34)
Albumin: 3.5 g/dL (ref 3.5–5.0)
BUN: 17.6 mg/dL (ref 7.0–26.0)
Calcium: 9.1 mg/dL (ref 8.4–10.4)
Chloride: 106 mEq/L (ref 98–109)
Glucose: 98 mg/dl (ref 70–140)
Potassium: 4 mEq/L (ref 3.5–5.1)
Sodium: 143 mEq/L (ref 136–145)
Total Protein: 6.9 g/dL (ref 6.4–8.3)

## 2013-06-09 LAB — CBC WITH DIFFERENTIAL/PLATELET
BASO%: 0.4 % (ref 0.0–2.0)
EOS%: 1.2 % (ref 0.0–7.0)
HCT: 38.9 % (ref 34.8–46.6)
HGB: 13.4 g/dL (ref 11.6–15.9)
MCHC: 34.6 g/dL (ref 31.5–36.0)
MONO%: 6.2 % (ref 0.0–14.0)
NEUT#: 4.6 10*3/uL (ref 1.5–6.5)
Platelets: 201 10*3/uL (ref 145–400)
RBC: 4.06 10*6/uL (ref 3.70–5.45)
RDW: 12.7 % (ref 11.2–14.5)
WBC: 6.8 10*3/uL (ref 3.9–10.3)
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
## 2013-06-14 ENCOUNTER — Encounter (INDEPENDENT_AMBULATORY_CARE_PROVIDER_SITE_OTHER): Payer: Self-pay | Admitting: General Surgery

## 2013-08-02 ENCOUNTER — Ambulatory Visit (INDEPENDENT_AMBULATORY_CARE_PROVIDER_SITE_OTHER): Payer: BC Managed Care – PPO | Admitting: General Surgery

## 2013-08-02 ENCOUNTER — Encounter (INDEPENDENT_AMBULATORY_CARE_PROVIDER_SITE_OTHER): Payer: Self-pay | Admitting: General Surgery

## 2013-08-02 VITALS — BP 116/72 | HR 68 | Temp 99.0°F | Resp 15 | Ht 66.0 in | Wt 145.6 lb

## 2013-08-02 DIAGNOSIS — Z853 Personal history of malignant neoplasm of breast: Secondary | ICD-10-CM

## 2013-08-02 NOTE — Patient Instructions (Signed)
Call if you find any hard nodules in the chest wall.

## 2013-08-02 NOTE — Progress Notes (Signed)
Procedure: 1. Bilateral mastectomies with tissue expander placements and left axillary sentinel lymph node biopsy 08/26/2012. 2. Left axillary lymph node dissection 09/12/2012.  Pathology: T1cN1a  History:  She is here for another long-term follow up visit.  She had her implants placed in April.  She is getting ready to have nipples placed.  She denies any chest wall nodules. She denies any adenopathy.  She is on tamoxifen and is tolerating it. Exam: General- Is in NAD. Chest wall-Bilateral scars are present. No nodules in the skin the subcutaneous tissue. Implants are present.  Lymph nodes-no palpable cervical, subclavicular, or axillary adenopathy the  Assessment:  T1cN1a left breast cancer-No clinical evidence of recurrence.  Plan:  Return visit in 3 months.

## 2013-08-10 ENCOUNTER — Other Ambulatory Visit: Payer: Self-pay

## 2013-09-19 ENCOUNTER — Other Ambulatory Visit (INDEPENDENT_AMBULATORY_CARE_PROVIDER_SITE_OTHER): Payer: Self-pay | Admitting: General Surgery

## 2013-09-19 DIAGNOSIS — C50912 Malignant neoplasm of unspecified site of left female breast: Secondary | ICD-10-CM

## 2013-09-26 ENCOUNTER — Ambulatory Visit: Payer: BC Managed Care – PPO | Attending: General Surgery | Admitting: Physical Therapy

## 2013-09-26 DIAGNOSIS — M79609 Pain in unspecified limb: Secondary | ICD-10-CM | POA: Insufficient documentation

## 2013-09-26 DIAGNOSIS — IMO0001 Reserved for inherently not codable concepts without codable children: Secondary | ICD-10-CM | POA: Insufficient documentation

## 2013-09-26 DIAGNOSIS — I890 Lymphedema, not elsewhere classified: Secondary | ICD-10-CM | POA: Insufficient documentation

## 2013-10-23 ENCOUNTER — Ambulatory Visit (INDEPENDENT_AMBULATORY_CARE_PROVIDER_SITE_OTHER): Payer: BC Managed Care – PPO | Admitting: General Surgery

## 2013-10-23 ENCOUNTER — Encounter (INDEPENDENT_AMBULATORY_CARE_PROVIDER_SITE_OTHER): Payer: Self-pay | Admitting: General Surgery

## 2013-10-23 VITALS — BP 104/68 | HR 72 | Temp 97.8°F | Resp 16 | Ht 66.0 in | Wt 149.6 lb

## 2013-10-23 DIAGNOSIS — C50919 Malignant neoplasm of unspecified site of unspecified female breast: Secondary | ICD-10-CM

## 2013-10-23 NOTE — Patient Instructions (Signed)
Call if you find any hard nodules on your chest wall skin.

## 2013-10-23 NOTE — Progress Notes (Signed)
Procedure: 1. Bilateral mastectomies with tissue expander placements and left axillary sentinel lymph node biopsy 08/26/2012. 2. Left axillary lymph node dissection 09/12/2012.  Pathology: T1cN1a  History:  She is here for another long-term follow up visit.  She had her implants placed in April.  She had the nipple areolar complex tattooed on her chest wall bilaterally. She denies any chest wall nodules. She denies any adenopathy.  She is on tamoxifen and is tolerating it. Exam: General- Is in NAD. Chest wall-Bilateral scars are present. No nodules in the skin the subcutaneous tissue. Implants are present.  Tattoos look godd.  Lymph nodes-no palpable cervical, subclavicular, or axillary adenopathy the  Assessment:  T1cN1a left breast cancer-No clinical evidence of recurrence.  Plan:  Return visit in 3 months.

## 2013-12-06 ENCOUNTER — Telehealth: Payer: Self-pay | Admitting: *Deleted

## 2013-12-06 ENCOUNTER — Other Ambulatory Visit: Payer: Self-pay | Admitting: *Deleted

## 2013-12-06 DIAGNOSIS — Z8249 Family history of ischemic heart disease and other diseases of the circulatory system: Secondary | ICD-10-CM

## 2013-12-06 DIAGNOSIS — R55 Syncope and collapse: Secondary | ICD-10-CM

## 2013-12-06 DIAGNOSIS — C50219 Malignant neoplasm of upper-inner quadrant of unspecified female breast: Secondary | ICD-10-CM

## 2013-12-06 NOTE — Telephone Encounter (Signed)
Patient called and would like to have a Vitamin D and Magnesium level drawn on Friday, 12/08/13, with her other labs. Per Dr. Humphrey Rolls, this is OK. Left a voice mail on patient's cell phone per her request.

## 2013-12-08 ENCOUNTER — Encounter: Payer: Self-pay | Admitting: Oncology

## 2013-12-08 ENCOUNTER — Telehealth: Payer: Self-pay | Admitting: Oncology

## 2013-12-08 ENCOUNTER — Other Ambulatory Visit (HOSPITAL_BASED_OUTPATIENT_CLINIC_OR_DEPARTMENT_OTHER): Payer: BC Managed Care – PPO

## 2013-12-08 ENCOUNTER — Ambulatory Visit (HOSPITAL_BASED_OUTPATIENT_CLINIC_OR_DEPARTMENT_OTHER): Payer: BC Managed Care – PPO | Admitting: Oncology

## 2013-12-08 VITALS — BP 129/85 | HR 76 | Temp 98.2°F | Resp 18 | Ht 66.0 in | Wt 151.5 lb

## 2013-12-08 DIAGNOSIS — R55 Syncope and collapse: Secondary | ICD-10-CM

## 2013-12-08 DIAGNOSIS — Z9010 Acquired absence of unspecified breast and nipple: Secondary | ICD-10-CM

## 2013-12-08 DIAGNOSIS — C50219 Malignant neoplasm of upper-inner quadrant of unspecified female breast: Secondary | ICD-10-CM

## 2013-12-08 DIAGNOSIS — C50919 Malignant neoplasm of unspecified site of unspecified female breast: Secondary | ICD-10-CM

## 2013-12-08 DIAGNOSIS — Z170 Estrogen receptor positive status [ER+]: Secondary | ICD-10-CM

## 2013-12-08 DIAGNOSIS — Z8249 Family history of ischemic heart disease and other diseases of the circulatory system: Secondary | ICD-10-CM

## 2013-12-08 LAB — CBC WITH DIFFERENTIAL/PLATELET
BASO%: 0.6 % (ref 0.0–2.0)
BASOS ABS: 0.1 10*3/uL (ref 0.0–0.1)
EOS%: 4 % (ref 0.0–7.0)
Eosinophils Absolute: 0.4 10*3/uL (ref 0.0–0.5)
HEMATOCRIT: 41.4 % (ref 34.8–46.6)
HGB: 13.9 g/dL (ref 11.6–15.9)
LYMPH%: 23.1 % (ref 14.0–49.7)
MCH: 33.1 pg (ref 25.1–34.0)
MCHC: 33.7 g/dL (ref 31.5–36.0)
MCV: 98.4 fL (ref 79.5–101.0)
MONO#: 0.5 10*3/uL (ref 0.1–0.9)
MONO%: 5.4 % (ref 0.0–14.0)
NEUT%: 66.9 % (ref 38.4–76.8)
NEUTROS ABS: 6.7 10*3/uL — AB (ref 1.5–6.5)
PLATELETS: 214 10*3/uL (ref 145–400)
RBC: 4.2 10*6/uL (ref 3.70–5.45)
RDW: 12.6 % (ref 11.2–14.5)
WBC: 9.9 10*3/uL (ref 3.9–10.3)
lymph#: 2.3 10*3/uL (ref 0.9–3.3)

## 2013-12-08 LAB — COMPREHENSIVE METABOLIC PANEL (CC13)
ALT: 15 U/L (ref 0–55)
ANION GAP: 10 meq/L (ref 3–11)
AST: 22 U/L (ref 5–34)
Albumin: 3.7 g/dL (ref 3.5–5.0)
Alkaline Phosphatase: 82 U/L (ref 40–150)
BUN: 15.6 mg/dL (ref 7.0–26.0)
CO2: 29 meq/L (ref 22–29)
CREATININE: 0.7 mg/dL (ref 0.6–1.1)
Calcium: 9.6 mg/dL (ref 8.4–10.4)
Chloride: 105 mEq/L (ref 98–109)
Glucose: 80 mg/dl (ref 70–140)
Potassium: 3.8 mEq/L (ref 3.5–5.1)
SODIUM: 144 meq/L (ref 136–145)
TOTAL PROTEIN: 7.5 g/dL (ref 6.4–8.3)
Total Bilirubin: 0.28 mg/dL (ref 0.20–1.20)

## 2013-12-08 LAB — MAGNESIUM (CC13): Magnesium: 2.1 mg/dl (ref 1.5–2.5)

## 2013-12-08 NOTE — Telephone Encounter (Signed)
, °

## 2013-12-09 LAB — VITAMIN D 25 HYDROXY (VIT D DEFICIENCY, FRACTURES): VIT D 25 HYDROXY: 60 ng/mL (ref 30–89)

## 2013-12-11 ENCOUNTER — Telehealth: Payer: Self-pay | Admitting: *Deleted

## 2013-12-11 NOTE — Telephone Encounter (Signed)
Left pt a message that Vitamin D and Magnesium levels are normal per Dr Humphrey Rolls
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
## 2014-01-16 ENCOUNTER — Other Ambulatory Visit: Payer: Self-pay | Admitting: Obstetrics and Gynecology

## 2014-01-17 ENCOUNTER — Ambulatory Visit (INDEPENDENT_AMBULATORY_CARE_PROVIDER_SITE_OTHER): Payer: BC Managed Care – PPO | Admitting: General Surgery

## 2014-01-17 ENCOUNTER — Encounter (INDEPENDENT_AMBULATORY_CARE_PROVIDER_SITE_OTHER): Payer: Self-pay | Admitting: General Surgery

## 2014-01-17 VITALS — BP 118/68 | HR 74 | Temp 97.4°F | Resp 14 | Ht 65.0 in | Wt 148.8 lb

## 2014-01-17 DIAGNOSIS — C50919 Malignant neoplasm of unspecified site of unspecified female breast: Secondary | ICD-10-CM

## 2014-01-17 NOTE — Progress Notes (Signed)
Procedure: 1. Bilateral mastectomies with tissue expander placements and left axillary sentinel lymph node biopsy 08/26/2012. 2. Left axillary lymph node dissection 09/12/2012.  Pathology: T1cN1a  History:  She is here for another long-term follow up visit.  She denies any chest wall nodules. She denies any adenopathy.  She is on tamoxifen and is tolerating it. Exam: General- Is in NAD. Chest wall-Bilateral scars are present. No nodules in the skin or the subcutaneous tissue. Implants are present.    Lymph nodes-no palpable cervical, subclavicular, or axillary adenopathy the  Assessment:  T1cN1a left breast cancer-No clinical evidence of recurrence.  Plan:  Return visit in 3 months.

## 2014-01-17 NOTE — Patient Instructions (Signed)
Call if you find any new lumps in your breasts or chest wall. 

## 2014-01-30 ENCOUNTER — Other Ambulatory Visit: Payer: Self-pay | Admitting: *Deleted

## 2014-01-30 DIAGNOSIS — C50219 Malignant neoplasm of upper-inner quadrant of unspecified female breast: Secondary | ICD-10-CM

## 2014-01-30 MED ORDER — TAMOXIFEN CITRATE 20 MG PO TABS: 20.0000 mg | ORAL_TABLET | Freq: Every day | ORAL | Status: DC

## 2014-03-28 ENCOUNTER — Encounter (INDEPENDENT_AMBULATORY_CARE_PROVIDER_SITE_OTHER): Payer: Self-pay | Admitting: General Surgery

## 2014-04-26 ENCOUNTER — Telehealth: Payer: Self-pay

## 2014-04-26 NOTE — Telephone Encounter (Signed)
LMOVM - returned patient call re-taking MAgnesium supplement to help with "brain fog and depression, 300-500 units".

## 2014-05-07 ENCOUNTER — Telehealth: Payer: Self-pay | Admitting: Hematology and Oncology

## 2014-05-07 ENCOUNTER — Encounter (INDEPENDENT_AMBULATORY_CARE_PROVIDER_SITE_OTHER): Payer: Self-pay

## 2014-05-07 ENCOUNTER — Telehealth: Payer: Self-pay | Admitting: *Deleted

## 2014-05-07 ENCOUNTER — Ambulatory Visit (INDEPENDENT_AMBULATORY_CARE_PROVIDER_SITE_OTHER): Payer: BC Managed Care – PPO | Admitting: General Surgery

## 2014-05-07 ENCOUNTER — Encounter (INDEPENDENT_AMBULATORY_CARE_PROVIDER_SITE_OTHER): Payer: Self-pay | Admitting: General Surgery

## 2014-05-07 VITALS — BP 110/70 | HR 72 | Temp 98.4°F | Resp 14 | Ht 66.0 in | Wt 149.4 lb

## 2014-05-07 DIAGNOSIS — C50912 Malignant neoplasm of unspecified site of left female breast: Secondary | ICD-10-CM

## 2014-05-07 DIAGNOSIS — C50919 Malignant neoplasm of unspecified site of unspecified female breast: Secondary | ICD-10-CM

## 2014-05-07 NOTE — Patient Instructions (Signed)
Call if you find any new lumps in your breasts or chest wall. 

## 2014-05-07 NOTE — Telephone Encounter (Signed)
lmonvm for pt on both home/cell re new appt for lb/VG 06/29/14. scheduled mailed. recieved message from triage that Dr. Bertrum Sol office called inquiring about new appt and provider for pt.

## 2014-05-07 NOTE — Telephone Encounter (Signed)
Call received from [facility] with Dr. Zella Richer called about mutual long term breast cancer f/u patient.  Requesting when this patient will be scheduled for next f/u.  Calling on patient's behalf as the current f/u on 06-22-2014 needs to be rescheduled with another provider.  Bernie asked what to tell the patient.  Asked that she tell patient a scheduler will call her when the new appointment is scheduled.  This nurse will ask scheduling team new projected 6 month f/u time frame.Kristin Spears

## 2014-05-07 NOTE — Progress Notes (Signed)
Procedure: 1. Bilateral mastectomies with tissue expander placements and left axillary sentinel lymph node biopsy 08/26/2012. 2. Left axillary lymph node dissection 09/12/2012.  Pathology: T1cN1a left breast cancer  History:  She is here for another long-term follow up visit.  She denies any chest wall nodules. She denies any adenopathy.  She occasionally gets some left arm swelling but with massage this rapidly goes away.  She is on tamoxifen and is tolerating it. Exam: General- Is in NAD. Chest wall-Bilateral scars are present. No nodules in the skin or the subcutaneous tissue. Implants are present.    Lymph nodes-no palpable cervical, subclavicular, or axillary adenopathy the  Assessment:  T1cN1a left breast cancer-No clinical evidence of recurrence.  Mild lymph edema is controlled with massage.  Plan:  Return visit in 3 months.

## 2014-06-22 ENCOUNTER — Other Ambulatory Visit: Payer: BC Managed Care – PPO

## 2014-06-22 ENCOUNTER — Ambulatory Visit: Payer: BC Managed Care – PPO | Admitting: Oncology

## 2014-06-28 ENCOUNTER — Other Ambulatory Visit: Payer: Self-pay | Admitting: *Deleted

## 2014-06-28 DIAGNOSIS — C50219 Malignant neoplasm of upper-inner quadrant of unspecified female breast: Secondary | ICD-10-CM

## 2014-06-29 ENCOUNTER — Encounter: Payer: Self-pay | Admitting: Hematology and Oncology

## 2014-06-29 ENCOUNTER — Other Ambulatory Visit (HOSPITAL_BASED_OUTPATIENT_CLINIC_OR_DEPARTMENT_OTHER): Payer: BC Managed Care – PPO

## 2014-06-29 ENCOUNTER — Telehealth: Payer: Self-pay | Admitting: Hematology and Oncology

## 2014-06-29 ENCOUNTER — Ambulatory Visit (HOSPITAL_BASED_OUTPATIENT_CLINIC_OR_DEPARTMENT_OTHER): Payer: BC Managed Care – PPO | Admitting: Hematology and Oncology

## 2014-06-29 VITALS — BP 140/89 | HR 75 | Temp 98.4°F | Resp 18 | Ht 66.0 in | Wt 150.7 lb

## 2014-06-29 DIAGNOSIS — C50219 Malignant neoplasm of upper-inner quadrant of unspecified female breast: Secondary | ICD-10-CM

## 2014-06-29 DIAGNOSIS — C50919 Malignant neoplasm of unspecified site of unspecified female breast: Secondary | ICD-10-CM

## 2014-06-29 DIAGNOSIS — R61 Generalized hyperhidrosis: Secondary | ICD-10-CM

## 2014-06-29 DIAGNOSIS — Z170 Estrogen receptor positive status [ER+]: Secondary | ICD-10-CM

## 2014-06-29 LAB — CBC WITH DIFFERENTIAL/PLATELET
BASO%: 0.5 % (ref 0.0–2.0)
Basophils Absolute: 0 10*3/uL (ref 0.0–0.1)
EOS%: 3.3 % (ref 0.0–7.0)
Eosinophils Absolute: 0.2 10*3/uL (ref 0.0–0.5)
HEMATOCRIT: 41 % (ref 34.8–46.6)
HGB: 13.7 g/dL (ref 11.6–15.9)
LYMPH%: 24.6 % (ref 14.0–49.7)
MCH: 32.9 pg (ref 25.1–34.0)
MCHC: 33.4 g/dL (ref 31.5–36.0)
MCV: 98.4 fL (ref 79.5–101.0)
MONO#: 0.4 10*3/uL (ref 0.1–0.9)
MONO%: 6.3 % (ref 0.0–14.0)
NEUT#: 4.4 10*3/uL (ref 1.5–6.5)
NEUT%: 65.3 % (ref 38.4–76.8)
PLATELETS: 190 10*3/uL (ref 145–400)
RBC: 4.17 10*6/uL (ref 3.70–5.45)
RDW: 13.3 % (ref 11.2–14.5)
WBC: 6.8 10*3/uL (ref 3.9–10.3)
lymph#: 1.7 10*3/uL (ref 0.9–3.3)

## 2014-06-29 LAB — COMPREHENSIVE METABOLIC PANEL (CC13)
ALK PHOS: 63 U/L (ref 40–150)
ALT: 11 U/L (ref 0–55)
ANION GAP: 8 meq/L (ref 3–11)
AST: 19 U/L (ref 5–34)
Albumin: 3.6 g/dL (ref 3.5–5.0)
BILIRUBIN TOTAL: 0.4 mg/dL (ref 0.20–1.20)
BUN: 17.2 mg/dL (ref 7.0–26.0)
CO2: 27 meq/L (ref 22–29)
Calcium: 9.6 mg/dL (ref 8.4–10.4)
Chloride: 106 mEq/L (ref 98–109)
Creatinine: 0.8 mg/dL (ref 0.6–1.1)
GLUCOSE: 90 mg/dL (ref 70–140)
Potassium: 4.1 mEq/L (ref 3.5–5.1)
Sodium: 141 mEq/L (ref 136–145)
Total Protein: 7.1 g/dL (ref 6.4–8.3)

## 2014-06-29 NOTE — Telephone Encounter (Signed)
, °

## 2014-06-29 NOTE — Progress Notes (Signed)
Patient Care Team: Horatio Pel, MD as PCP - General (Internal Medicine)  DIAGNOSIS: Cancer of upper-inner quadrant of female breast   Primary site: Breast (Left)   Staging method: AJCC 7th Edition   Clinical: Stage IA (T1c, N0, cM0)   Summary: Stage IA (T1c, N0, cM0)   Clinical comments: Staged at breast conference 10.2.13   SUMMARY OF ONCOLOGIC HISTORY:   Cancer of upper-inner quadrant of female breast   07/04/2012 Initial Diagnosis Cancer of upper-inner quadrant of female breast   08/26/2012 Surgery Left mastectomy: 2 foci of invasive lobular carcinoma 1.5 and 1.1 cm grade 1 invasive LCIS one out of 4 SLN positive additional 4 axillary nodes negative: Right breast mastectomy: benign: Oncotype DX recurrence score 4 low risk   09/06/2012 -  Anti-estrogen oral therapy Tamoxifen 20 mg daily 10 years is of planned therapy    Procedure Genetic testing negative for abnormal gene mutations    CHIEF COMPLIANT: Followup of breast cancer  INTERVAL HISTORY: Kristin Spears is a 48 year old Caucasian lady with above-mentioned history of left-sided breast cancer treated with bilateral mastectomies. Even though she had node positive disease, she did not require chemotherapy because of low risk Oncotype DX recurrence score. She went on tamoxifen therapy since December 2013. She has been tolerating tamoxifen fairly well without any major problems or concerns. She has been developing some hot flashes recently and her periods also become few and far between. She has been taking a lot of nutritional supplementation including 15,000 IUs of vitamin D for depression along with 400 mg of magnesium every day. She reports that this regimen is helping her a lot. She is planning on starting taking probiotics as well. In addition to that she takes B complex folate acid as well as multivitamin. 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 She is a 60-year-old son.  REVIEW OF SYSTEMS:   Constitutional: Denies fevers,  chills or abnormal weight loss Eyes: Denies blurriness of vision Ears, nose, mouth, throat, and face: Denies mucositis or sore throat Respiratory: Denies cough, dyspnea or wheezes Cardiovascular: Denies palpitation, chest discomfort or lower extremity swelling Gastrointestinal:  Denies nausea, heartburn or change in bowel habits Skin: Denies abnormal skin rashes Lymphatics: Denies new lymphadenopathy or easy bruising Neurological:Denies numbness, tingling or new weaknesses Behavioral/Psych: Patient post depression symptoms  Breast:  denies any pain or lumps or nodules in either breasts All other systems were reviewed with the patient and are negative.  I have reviewed the past medical history, past surgical history, social history and family history with the patient and they are unchanged from previous note.  ALLERGIES:  is allergic to codeine; morphine and related; other; rose hips; malt; probiotic; banana; claritin; naldecon senior; oat; penicillins; seldane; and sulfa antibiotics.  MEDICATIONS:  Current Outpatient Prescriptions  Medication Sig Dispense Refill  . acetaminophen (TYLENOL) 325 MG tablet Take 650 mg by mouth every 6 (six) hours as needed for pain.      Marland Kitchen acidophilus (RISAQUAD) CAPS capsule Take 1 capsule by mouth daily.      . Amino Acids (AMINO ACID PO) Take by mouth.      Marland Kitchen b complex vitamins capsule Take 1 capsule by mouth daily.      . cetirizine (ZYRTEC) 10 MG tablet Take 10 mg by mouth daily.      . Cholecalciferol (VITAMIN D PO) Take 15,000 Units by mouth daily.       Marland Kitchen Fexofenadine HCl (MUCINEX ALLERGY PO) Take by mouth.      Marland Kitchen  Folic Acid-Vit J3-HLK T62 (FOLBEE) 2.5-25-1 MG TABS Take 1 tablet by mouth daily.      Marland Kitchen ibuprofen (ADVIL,MOTRIN) 100 MG tablet Take 100 mg by mouth every 6 (six) hours as needed.      . Magnesium 400 MG CAPS Take by mouth at bedtime.      . Multiple Vitamins-Minerals (MULTIVITAMIN WITH MINERALS) tablet Take 1 tablet by mouth daily.      .  Omega-3 Fatty Acids (OMEGA 3 PO) Take by mouth daily.      . tamoxifen (NOLVADEX) 20 MG tablet Take 1 tablet (20 mg total) by mouth daily.  90 tablet  1  . ValACYclovir HCl (VALTREX PO) Take by mouth.       No current facility-administered medications for this visit.    PHYSICAL EXAMINATION: ECOG PERFORMANCE STATUS: 0 - Asymptomatic  Filed Vitals:   06/29/14 1019  BP: 140/89  Pulse: 75  Temp: 98.4 F (36.9 C)  Resp: 18   Filed Weights   06/29/14 1019  Weight: 150 lb 11.2 oz (68.357 kg)    GENERAL:alert, no distress and comfortable SKIN: skin color, texture, turgor are normal, no rashes or significant lesions EYES: normal, Conjunctiva are pink and non-injected, sclera clear OROPHARYNX:no exudate, no erythema and lips, buccal mucosa, and tongue normal  NECK: supple, thyroid normal size, non-tender, without nodularity LYMPH:  no palpable lymphadenopathy in the cervical, axillary or inguinal LUNGS: clear to auscultation and percussion with normal breathing effort HEART: regular rate & rhythm and no murmurs and no lower extremity edema ABDOMEN:abdomen soft, non-tender and normal bowel sounds Musculoskeletal:no cyanosis of digits and no clubbing  NEURO: alert & oriented x 3 with fluent speech, no focal motor/sensory deficits  LABORATORY DATA:  I have reviewed the data as listed   Chemistry      Component Value Date/Time   NA 141 06/29/2014 1000   NA 137 09/09/2012 1005   K 4.1 06/29/2014 1000   K 4.6 09/09/2012 1005   CL 105 12/16/2012 1218   CL 101 09/09/2012 1005   CO2 27 06/29/2014 1000   CO2 28 09/09/2012 1005   BUN 17.2 06/29/2014 1000   BUN 20 09/09/2012 1005   CREATININE 0.8 06/29/2014 1000   CREATININE 0.98 09/09/2012 1005      Component Value Date/Time   CALCIUM 9.6 06/29/2014 1000   CALCIUM 9.1 09/09/2012 1005   ALKPHOS 63 06/29/2014 1000   ALKPHOS 76 08/19/2012 1334   AST 19 06/29/2014 1000   AST 17 08/19/2012 1334   ALT 11 06/29/2014 1000   ALT 10 08/19/2012 1334    BILITOT 0.40 06/29/2014 1000   BILITOT 0.4 08/19/2012 1334       Lab Results  Component Value Date   WBC 6.8 06/29/2014   HGB 13.7 06/29/2014   HCT 41.0 06/29/2014   MCV 98.4 06/29/2014   PLT 190 06/29/2014   NEUTROABS 4.4 06/29/2014     RADIOGRAPHIC STUDIES: I have personally reviewed the radiology reports and agreed with their findings. No results found.   ASSESSMENT & PLAN:  Cancer of upper-inner quadrant of female breast multifocal invasive lobular carcinoma of the left breast (T1 N1) stage II A. Patient is status post bilateral mastectomies with immediate reconstruction. Patient is tamoxifen stating that she is taking for the past 2 and half years. Apart from intermittent hot flashes she is tolerating it fairly well. She is GYN checkups twice a year. She is working at [employer]. and stays fairly active and exercises. She  takes a lot of supplements. I encouraged her to stop taking a few supplements and to streamline her supplement intake. She is taking 15,000 IUs of vitamin D. She reports that this is helping her depression symptoms. She is also taking 400 mg of magnesium every day.  Since she had bilateral mastectomies there is no indication to further imaging studies. I will see her back in 6 months. At that time we'll check blood work including a niece and rales and vitamin D levels. She plans to stop taking probiotics.  Survivorship: Encouraged the patient to continue to do physical exercise and eat more fruits and vegetables.     Orders Placed This Encounter  Procedures  . CBC with Differential    Standing Status: Future     Number of Occurrences:      Standing Expiration Date: 06/29/2015  . Comprehensive metabolic panel (Cmet) - [facility]    Standing Status: Future     Number of Occurrences:      Standing Expiration Date: 06/29/2015  . Vit D  25 hydroxy (rtn osteoporosis monitoring)    Standing Status: Standing     Number of Occurrences: 1     Standing Expiration Date:    The  patient has a good understanding of the overall plan. she agrees with it. She will call with any problems that may develop before her next visit here.  I spent 25 minutes counseling the patient face to face. The total time spent in the appointment was 30 minutes and more than 50% was on counseling and review of test results    Rulon Eisenmenger, MD 06/29/2014 12:49 PM

## 2014-06-29 NOTE — Assessment & Plan Note (Signed)
multifocal invasive lobular carcinoma of the left breast (T1 N1) stage II A. Patient is status post bilateral mastectomies with immediate reconstruction. Patient is tamoxifen stating that she is taking for the past 2 and half years. Apart from intermittent hot flashes she is tolerating it fairly well. She is GYN checkups twice a year. She is working at [employer]. and stays fairly active and exercises. She takes a lot of supplements. I encouraged her to stop taking a few supplements and to streamline her supplement intake. She is taking 15,000 IUs of vitamin D. She reports that this is helping her depression symptoms. She is also taking 400 mg of magnesium every day.  Since she had bilateral mastectomies there is no indication to further imaging studies. I will see her back in 6 months. At that time we'll check blood work including a niece and rales and vitamin D levels. She plans to stop taking probiotics.  Survivorship: Encouraged the patient to continue to do physical exercise and eat more fruits and vegetables.

## 2014-07-11 ENCOUNTER — Telehealth: Payer: Self-pay | Admitting: *Deleted

## 2014-07-11 NOTE — Telephone Encounter (Signed)
Received lab results from Dr. Shelia Media with [facility]. Sent to scan.

## 2014-07-29 ENCOUNTER — Other Ambulatory Visit: Payer: Self-pay | Admitting: Hematology and Oncology

## 2014-07-30 ENCOUNTER — Other Ambulatory Visit: Payer: Self-pay | Admitting: *Deleted

## 2014-07-30 DIAGNOSIS — C50219 Malignant neoplasm of upper-inner quadrant of unspecified female breast: Secondary | ICD-10-CM

## 2014-07-30 MED ORDER — TAMOXIFEN CITRATE 20 MG PO TABS: 20.0000 mg | ORAL_TABLET | Freq: Every day | ORAL | Status: DC

## 2014-08-06 ENCOUNTER — Encounter: Payer: Self-pay | Admitting: Hematology and Oncology

## 2014-08-08 ENCOUNTER — Other Ambulatory Visit: Payer: Self-pay | Admitting: Obstetrics and Gynecology

## 2014-09-04 ENCOUNTER — Telehealth: Payer: Self-pay | Admitting: Oncology

## 2014-09-07 ENCOUNTER — Encounter (INDEPENDENT_AMBULATORY_CARE_PROVIDER_SITE_OTHER): Payer: Self-pay | Admitting: General Surgery

## 2014-09-07 NOTE — Progress Notes (Signed)
Patient ID: Kristin Spears, female   DOB: [birth year], 48 y.o.   MRN: [id number]  Kristin Kitts. Spears 09/07/2014 3:52 PM Location: [facility] Patient #: [id number] DOB: 08/10/1966 Married / Language: English / Race: White Female History of Present Illness Kristin Hollingshead MD; 09/07/2014 4:08 PM) Patient words: fu double masty 2013 but also felt a lump last week.  The patient is a 48 year old female    Note:Procedure: 1. Bilateral mastectomies with tissue expander placements and left axillary sentinel lymph node biopsy 08/26/2012. 2. Left axillary lymph node dissection 09/12/2012.  Pathology: T1cN1a left breast cancer  History: She is here for another long-term follow up visit. She felt a small chest wall nodule at the 11 o'clock position of the left nipple areolar complex on Thanksgiving day. However, this has completely regressed and is no longer noticeable. She denies any adenopathy. She is on tamoxifen and is tolerating it. Exam: General- Is in NAD. Chest wall-Bilateral scars are present. No nodules in the skin or the subcutaneous tissue. Implants are present.   Lymph nodes-no palpable cervical, subclavicular, or axillary adenopathy the    Other Problems Festus Holts, LPN; 56/01/3328 5:18 PM) Anxiety Disorder Bladder Problems Breast Cancer Depression Gastroesophageal Reflux Disease General anesthesia - complications Hemorrhoids  Past Surgical History Festus Holts, LPN; 84/10/6604 3:01 PM) Breast Biopsy Left. Breast Reconstruction Bilateral. Cesarean Section - 1 Foot Surgery Bilateral. Mastectomy Bilateral. Oral Surgery  Diagnostic Studies History Festus Holts, LPN; 60/10/930 3:55 PM) Colonoscopy never Mammogram 1-3 years ago Pap Smear 1-5 years ago  Allergies Festus Holts, LPN; 73/11/2023 4:27 PM) Codeine Sulfate *ANALGESICS - OPIOID* Morphine Sulfate (Concentrate) *ANALGESICS - OPIOID* Claritin  *ANTIHISTAMINES* Naldecon *COUGH/COLD/ALLERGY* Penicillins Seldane *ANTIHISTAMINES* Sulfa Antibiotics  Medication History Festus Holts, LPN; 03/06/3761 8:31 PM) Tamoxifen Citrate (20MG  Tablet, Oral) Active. Omega 3 (1200MG  Capsule, Oral) Active. Multivitamin/Multimineral (Oral) Active. Magnesium Gluconate (500MG  Tablet, Oral) Active. Folbee Plus (Oral) Active. Vitamin D (Cholecalciferol) (1000UNIT Tablet, Oral) Active. B Complex (Oral) Active. Amino Acids Complex (Oral) Active. RisaQuad (Oral) Active. Advil (200MG  Capsule, Oral prn) Active.  Social History Festus Holts, LPN; 51/04/6159 7:37 PM) Caffeine use Tea. No alcohol use No drug use Tobacco use Never smoker.  Family History Festus Holts, LPN; 07/10/2693 8:54 PM) Arthritis Father. Depression Sister. Heart Disease Father, Mother. Heart disease in female family member before age 35 Heart disease in female family member before age 63 Thyroid problems Mother.  Pregnancy / Birth History Festus Holts, LPN; 62/04/349 0:93 PM) Contraceptive History Contraceptive implant, Oral contraceptives. Gravida 3 Irregular periods Maternal age 74-20 Para 1     Review of Systems Festus Holts LPN; 81/05/2992 7:16 PM) General Present- Weight Gain. Not Present- Appetite Loss, Chills, Fatigue, Fever, Night Sweats and Weight Loss. Skin Present- Rash. Not Present- Change in Wart/Mole, Dryness, Hives, Jaundice, New Lesions, Non-Healing Wounds and Ulcer. HEENT Present- Seasonal Allergies and Wears glasses/contact lenses. Not Present- Earache, Hearing Loss, Hoarseness, Nose Bleed, Oral Ulcers, Ringing in the Ears, Sinus Pain, Sore Throat, Visual Disturbances and Yellow Eyes. Respiratory Not Present- Bloody sputum, Chronic Cough, Difficulty Breathing, Snoring and Wheezing. Breast Not Present- Breast Mass, Breast Pain, Nipple Discharge and Skin Changes. Cardiovascular Not Present- Chest Pain, Difficulty  Breathing Lying Down, Leg Cramps, Palpitations, Rapid Heart Rate, Shortness of Breath and Swelling of Extremities. Gastrointestinal Present- Hemorrhoids and Rectal Pain. Not Present- Abdominal Pain, Bloating, Bloody Stool, Change in Bowel Habits, Chronic diarrhea, Constipation, Difficulty Swallowing, Excessive gas, Gets full quickly at meals, Indigestion, Nausea and Vomiting. Female Genitourinary Present-  Urgency. Not Present- Frequency, Nocturia, Painful Urination and Pelvic Pain. Musculoskeletal Not Present- Back Pain, Joint Pain, Joint Stiffness, Muscle Pain, Muscle Weakness and Swelling of Extremities. Neurological Not Present- Decreased Memory, Fainting, Headaches, Numbness, Seizures, Tingling, Tremor, Trouble walking and Weakness. Psychiatric Present- Anxiety and Depression. Not Present- Bipolar, Change in Sleep Pattern, Fearful and Frequent crying. Endocrine Present- Hair Changes. Not Present- Cold Intolerance, Excessive Hunger, Heat Intolerance, Hot flashes and New Diabetes. Hematology Not Present- Easy Bruising, Excessive bleeding, Gland problems, HIV and Persistent Infections.  Vitals Festus Holts LPN; 07/06/7252 6:64 PM) 09/07/2014 3:59 PM Weight: 148.25 lb Height: 66in Body Surface Area: 1.77 m Body Mass Index: 23.93 kg/m Temp.: 98.32F(Temporal)  Pulse: 78 (Regular)  Resp.: 16 (Unlabored)  BP: 108/76 (Sitting, Left Arm, Standard)     Assessment & Plan Kristin Hollingshead MD; 09/07/2014 4:08 PM)  HISTORY OF BREAST CANCER (V10.3  Z85.3) Impression: Assessment: T1cN1a left breast cancer-No clinical evidence of recurrence. No palpable left chest wall nodule  Plan: Return visit in 9 months.  Kristin Confer, MD

## 2014-09-14 ENCOUNTER — Other Ambulatory Visit: Payer: Self-pay | Admitting: Obstetrics and Gynecology

## 2014-10-10 ENCOUNTER — Telehealth: Payer: Self-pay | Admitting: Nutrition

## 2014-10-10 NOTE — Telephone Encounter (Signed)
Patient called and left message for me to return recall. Patient wishes to schedule a nutrition appointment. I called patient, however, she was unavailable. Left my name and contact information.

## 2014-11-15 ENCOUNTER — Ambulatory Visit: Payer: BC Managed Care – PPO | Admitting: Nutrition

## 2014-11-15 NOTE — Progress Notes (Signed)
49 year old female diagnosed with breast cancer.  Patient is taking tamoxifen. Patient requested appointment to review food interactions with tamoxifen. Patient has many allergies including rose hips,malt, probiotic, banana, oats. Patient is taking multiple supplements which have been approved by physician.  Nutrition diagnosis: Food and nutrition related knowledge deficit related to breast cancer and associated treatments as evidenced by no prior need for nutrition related information.  Intervention: Patient educated on strategies for increasing plant-based foods and increasing activity to a minimum of 150 minutes weekly. Patient provided with education fact sheets. Reviewed oral supplements.  Questions were answered.  Teach back method used.  Monitoring, evaluation, goals: Patient will consume healthy plant-based diet.  No follow-up is required.

## 2014-11-19 ENCOUNTER — Ambulatory Visit: Payer: Self-pay | Admitting: Podiatry

## 2014-11-26 ENCOUNTER — Other Ambulatory Visit: Payer: Self-pay | Admitting: Podiatry

## 2014-11-26 ENCOUNTER — Ambulatory Visit (INDEPENDENT_AMBULATORY_CARE_PROVIDER_SITE_OTHER): Payer: BC Managed Care – PPO | Admitting: Podiatry

## 2014-11-26 ENCOUNTER — Ambulatory Visit (INDEPENDENT_AMBULATORY_CARE_PROVIDER_SITE_OTHER): Payer: BC Managed Care – PPO

## 2014-11-26 DIAGNOSIS — M79672 Pain in left foot: Secondary | ICD-10-CM

## 2014-11-26 DIAGNOSIS — M779 Enthesopathy, unspecified: Secondary | ICD-10-CM

## 2014-11-26 DIAGNOSIS — S93402A Sprain of unspecified ligament of left ankle, initial encounter: Secondary | ICD-10-CM

## 2014-11-26 MED ORDER — TRIAMCINOLONE ACETONIDE 10 MG/ML IJ SUSP
10.0000 mg | Freq: Once | INTRAMUSCULAR | Status: AC
  Administered 2014-11-26: 10 mg

## 2014-11-27 NOTE — Progress Notes (Signed)
Subjective:     Patient ID: Kristin Spears, female   DOB: Aug 06, 1966, 49 y.o.   MRN: [id number]  HPI patient presents stating I turned my left ankle and it's been bothering me off and on and worse over the last month or 2. I do not have unstable ankle but I have had several ankle sprains   Review of Systems  All other systems reviewed and are negative.      Objective:   Physical Exam  Constitutional: She is oriented to person, place, and time.  Cardiovascular: Intact distal pulses.   Musculoskeletal: Normal range of motion.  Neurological: She is oriented to person, place, and time.  Skin: Skin is warm.  Nursing note and vitals reviewed.  neurovascular status is found to be intact with muscle strength adequate and range of motion within normal limits. Patient's found to have no excessive inversion left that does have quite a bit of discomfort in the sinus tarsi left with fluid buildup and slightly into the lateral ankle gutter but no crepitus upon movement or indication of muscle strength loss     Assessment:     History of sprained ankle with probable sinus tarsitis left    Plan:     Reviewed both conditions and today continue brace that she already has did careful sinus tarsi injection 3 mg Kenalog 5 mg Xylocaine and advised on reduced activity for several days

## 2014-12-27 ENCOUNTER — Ambulatory Visit: Payer: BC Managed Care – PPO | Admitting: Hematology and Oncology

## 2014-12-27 ENCOUNTER — Telehealth: Payer: Self-pay | Admitting: Oncology

## 2014-12-27 ENCOUNTER — Other Ambulatory Visit (HOSPITAL_BASED_OUTPATIENT_CLINIC_OR_DEPARTMENT_OTHER): Payer: BC Managed Care – PPO

## 2014-12-27 ENCOUNTER — Other Ambulatory Visit: Payer: BC Managed Care – PPO

## 2014-12-27 ENCOUNTER — Ambulatory Visit (HOSPITAL_BASED_OUTPATIENT_CLINIC_OR_DEPARTMENT_OTHER): Payer: BC Managed Care – PPO | Admitting: Oncology

## 2014-12-27 VITALS — BP 107/73 | HR 80 | Temp 98.3°F | Resp 18 | Ht 66.0 in | Wt 147.3 lb

## 2014-12-27 DIAGNOSIS — C50219 Malignant neoplasm of upper-inner quadrant of unspecified female breast: Secondary | ICD-10-CM

## 2014-12-27 DIAGNOSIS — C50919 Malignant neoplasm of unspecified site of unspecified female breast: Secondary | ICD-10-CM

## 2014-12-27 DIAGNOSIS — C50112 Malignant neoplasm of central portion of left female breast: Secondary | ICD-10-CM

## 2014-12-27 DIAGNOSIS — E559 Vitamin D deficiency, unspecified: Secondary | ICD-10-CM

## 2014-12-27 LAB — CBC WITH DIFFERENTIAL/PLATELET
BASO%: 0.3 % (ref 0.0–2.0)
Basophils Absolute: 0 10*3/uL (ref 0.0–0.1)
EOS ABS: 0.2 10*3/uL (ref 0.0–0.5)
EOS%: 2 % (ref 0.0–7.0)
HCT: 41.7 % (ref 34.8–46.6)
HGB: 13.9 g/dL (ref 11.6–15.9)
LYMPH%: 21.3 % (ref 14.0–49.7)
MCH: 32.6 pg (ref 25.1–34.0)
MCHC: 33.3 g/dL (ref 31.5–36.0)
MCV: 97.8 fL (ref 79.5–101.0)
MONO#: 0.6 10*3/uL (ref 0.1–0.9)
MONO%: 5.7 % (ref 0.0–14.0)
NEUT#: 7.4 10*3/uL — ABNORMAL HIGH (ref 1.5–6.5)
NEUT%: 70.7 % (ref 38.4–76.8)
Platelets: 191 10*3/uL (ref 145–400)
RBC: 4.26 10*6/uL (ref 3.70–5.45)
RDW: 12.9 % (ref 11.2–14.5)
WBC: 10.4 10*3/uL — AB (ref 3.9–10.3)
lymph#: 2.2 10*3/uL (ref 0.9–3.3)

## 2014-12-27 LAB — COMPREHENSIVE METABOLIC PANEL (CC13)
ALK PHOS: 67 U/L (ref 40–150)
ALT: 19 U/L (ref 0–55)
ANION GAP: 10 meq/L (ref 3–11)
AST: 24 U/L (ref 5–34)
Albumin: 3.8 g/dL (ref 3.5–5.0)
BUN: 16.7 mg/dL (ref 7.0–26.0)
CO2: 28 mEq/L (ref 22–29)
CREATININE: 0.9 mg/dL (ref 0.6–1.1)
Calcium: 9 mg/dL (ref 8.4–10.4)
Chloride: 104 mEq/L (ref 98–109)
EGFR: 73 mL/min/{1.73_m2} — ABNORMAL LOW (ref >90)
Glucose: 101 mg/dl (ref 70–140)
Potassium: 4.2 mEq/L (ref 3.5–5.1)
Sodium: 142 mEq/L (ref 136–145)
TOTAL PROTEIN: 7.2 g/dL (ref 6.4–8.3)
Total Bilirubin: 0.5 mg/dL (ref 0.20–1.20)

## 2014-12-27 NOTE — Telephone Encounter (Signed)
Appointments made and avs printed for patient °
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
## 2015-01-16 ENCOUNTER — Other Ambulatory Visit: Payer: Self-pay | Admitting: Obstetrics and Gynecology

## 2015-01-17 LAB — CYTOLOGY - PAP

## 2015-01-22 ENCOUNTER — Other Ambulatory Visit: Payer: Self-pay | Admitting: Hematology and Oncology

## 2015-06-25 ENCOUNTER — Other Ambulatory Visit: Payer: Self-pay | Admitting: General Surgery

## 2015-06-25 DIAGNOSIS — C50912 Malignant neoplasm of unspecified site of left female breast: Secondary | ICD-10-CM

## 2015-06-25 NOTE — Addendum Note (Signed)
Addended by: Odis Hollingshead on: 06/25/2015 11:36 AM   Modules accepted: Orders

## 2015-08-01 ENCOUNTER — Other Ambulatory Visit: Payer: Self-pay | Admitting: *Deleted

## 2015-08-01 MED ORDER — TAMOXIFEN CITRATE 20 MG PO TABS
20.0000 mg | ORAL_TABLET | Freq: Every day | ORAL | Status: DC
Start: 2015-08-01 — End: 2016-01-20

## 2015-12-26 ENCOUNTER — Other Ambulatory Visit: Payer: Self-pay

## 2015-12-26 ENCOUNTER — Telehealth: Payer: Self-pay | Admitting: Oncology

## 2015-12-26 DIAGNOSIS — C50912 Malignant neoplasm of unspecified site of left female breast: Secondary | ICD-10-CM

## 2015-12-26 NOTE — Telephone Encounter (Signed)
Due to [facility] 3/30 lab/fu moved to 4/7. Left message for patient and mailed schedule.

## 2015-12-26 NOTE — Progress Notes (Signed)
Patient requesting Vitamin D to be added on to upcoming lab appt.

## 2016-01-02 ENCOUNTER — Encounter: Payer: BC Managed Care – PPO | Admitting: Oncology

## 2016-01-02 ENCOUNTER — Other Ambulatory Visit: Payer: BC Managed Care – PPO

## 2016-01-10 ENCOUNTER — Ambulatory Visit: Payer: BC Managed Care – PPO | Admitting: Oncology

## 2016-01-10 ENCOUNTER — Other Ambulatory Visit: Payer: BC Managed Care – PPO

## 2016-01-20 ENCOUNTER — Other Ambulatory Visit (HOSPITAL_BASED_OUTPATIENT_CLINIC_OR_DEPARTMENT_OTHER): Payer: BC Managed Care – PPO

## 2016-01-20 ENCOUNTER — Telehealth: Payer: Self-pay | Admitting: Oncology

## 2016-01-20 ENCOUNTER — Other Ambulatory Visit: Payer: Self-pay

## 2016-01-20 ENCOUNTER — Ambulatory Visit (HOSPITAL_BASED_OUTPATIENT_CLINIC_OR_DEPARTMENT_OTHER): Payer: BC Managed Care – PPO | Admitting: Oncology

## 2016-01-20 VITALS — BP 142/86 | HR 96 | Temp 93.0°F | Resp 18 | Ht 66.0 in | Wt 147.5 lb

## 2016-01-20 DIAGNOSIS — C50812 Malignant neoplasm of overlapping sites of left female breast: Secondary | ICD-10-CM | POA: Diagnosis not present

## 2016-01-20 DIAGNOSIS — Z79810 Long term (current) use of selective estrogen receptor modulators (SERMs): Secondary | ICD-10-CM | POA: Diagnosis not present

## 2016-01-20 DIAGNOSIS — C50212 Malignant neoplasm of upper-inner quadrant of left female breast: Secondary | ICD-10-CM

## 2016-01-20 DIAGNOSIS — C50912 Malignant neoplasm of unspecified site of left female breast: Secondary | ICD-10-CM

## 2016-01-20 DIAGNOSIS — Z170 Estrogen receptor positive status [ER+]: Secondary | ICD-10-CM

## 2016-01-20 LAB — COMPREHENSIVE METABOLIC PANEL
ALT: 16 U/L (ref 0–55)
ANION GAP: 8 meq/L (ref 3–11)
AST: 21 U/L (ref 5–34)
Albumin: 3.7 g/dL (ref 3.5–5.0)
Alkaline Phosphatase: 61 U/L (ref 40–150)
BUN: 18 mg/dL (ref 7.0–26.0)
CHLORIDE: 109 meq/L (ref 98–109)
CO2: 24 meq/L (ref 22–29)
Calcium: 9.4 mg/dL (ref 8.4–10.4)
Creatinine: 0.9 mg/dL (ref 0.6–1.1)
EGFR: 78 mL/min/{1.73_m2} — AB (ref >90)
GLUCOSE: 112 mg/dL (ref 70–140)
Potassium: 3.9 mEq/L (ref 3.5–5.1)
SODIUM: 141 meq/L (ref 136–145)
Total Bilirubin: 0.38 mg/dL (ref 0.20–1.20)
Total Protein: 7.2 g/dL (ref 6.4–8.3)

## 2016-01-20 LAB — CBC WITH DIFFERENTIAL/PLATELET
BASO%: 0.4 % (ref 0.0–2.0)
Basophils Absolute: 0 10*3/uL (ref 0.0–0.1)
EOS ABS: 0.2 10*3/uL (ref 0.0–0.5)
EOS%: 2 % (ref 0.0–7.0)
HCT: 41 % (ref 34.8–46.6)
HGB: 13.9 g/dL (ref 11.6–15.9)
LYMPH%: 26.1 % (ref 14.0–49.7)
MCH: 32.4 pg (ref 25.1–34.0)
MCHC: 33.9 g/dL (ref 31.5–36.0)
MCV: 95.4 fL (ref 79.5–101.0)
MONO#: 0.5 10*3/uL (ref 0.1–0.9)
MONO%: 6 % (ref 0.0–14.0)
NEUT%: 65.5 % (ref 38.4–76.8)
NEUTROS ABS: 6 10*3/uL (ref 1.5–6.5)
PLATELETS: 192 10*3/uL (ref 145–400)
RBC: 4.29 10*6/uL (ref 3.70–5.45)
RDW: 13 % (ref 11.2–14.5)
WBC: 9.1 10*3/uL (ref 3.9–10.3)
lymph#: 2.4 10*3/uL (ref 0.9–3.3)

## 2016-01-20 MED ORDER — ESTRADIOL 2 MG VA RING: 2.0000 mg | VAGINAL_RING | VAGINAL | Status: DC

## 2016-01-20 MED ORDER — METRONIDAZOLE 1 % EX GEL
Freq: Every day | CUTANEOUS | Status: DC
Start: 2016-01-20 — End: 2018-01-14

## 2016-01-20 MED ORDER — TAMOXIFEN CITRATE 20 MG PO TABS: 20.0000 mg | ORAL_TABLET | Freq: Every day | ORAL | Status: DC

## 2016-01-20 MED ORDER — ESTROGENS, CONJUGATED 0.625 MG/GM VA CREA: 1.0000 | TOPICAL_CREAM | Freq: Every day | VAGINAL | Status: DC

## 2016-01-20 NOTE — Telephone Encounter (Signed)
appt made and avs printed °
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
## 2016-01-21 LAB — VITAMIN D 25 HYDROXY (VIT D DEFICIENCY, FRACTURES): VIT D 25 HYDROXY: 90.8 ng/mL (ref 30.0–100.0)

## 2016-06-03 ENCOUNTER — Telehealth: Payer: Self-pay | Admitting: *Deleted

## 2016-06-03 NOTE — Telephone Encounter (Signed)
This RN returned call to pt per VM left by her with concerns secondary to " ongoing memory loss since being on the tamoxifen "  " I need to continue to work and my memory recall is starting to be a concern "  " I am being told that I need to call my doctor and have the tamoxifen changed - not that I want to do that but I am concerned about my memory recall "  This RN returned call to pt and discussed above including noted diagnosis of ADHD and if she is followed by any one. 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  This RN discussed memory fog relating to diagnosis, use of anti estrogen as well as other factors including pt being perimenopause and known ADHD and other learning disabilities.  Change in tamoxifen may not provide benefit - which pt understands and desires to maintain appropriate medication for cancer not to return.  Plan per discussion is pt will be referred to a neuropsychiatrist which pt is agreeable to.

## 2016-09-18 NOTE — Telephone Encounter (Signed)
No entry 

## 2017-01-18 ENCOUNTER — Ambulatory Visit (INDEPENDENT_AMBULATORY_CARE_PROVIDER_SITE_OTHER): Payer: BC Managed Care – PPO

## 2017-01-18 ENCOUNTER — Encounter: Payer: Self-pay | Admitting: Podiatry

## 2017-01-18 ENCOUNTER — Ambulatory Visit (INDEPENDENT_AMBULATORY_CARE_PROVIDER_SITE_OTHER): Payer: BC Managed Care – PPO | Admitting: Podiatry

## 2017-01-18 DIAGNOSIS — M722 Plantar fascial fibromatosis: Secondary | ICD-10-CM

## 2017-01-18 MED ORDER — TRIAMCINOLONE ACETONIDE 10 MG/ML IJ SUSP
10.0000 mg | Freq: Once | INTRAMUSCULAR | Status: AC
  Administered 2017-01-18: 10 mg

## 2017-01-18 NOTE — Progress Notes (Signed)
Subjective:     Patient ID: Kristin Spears, female   DOB: 1965/12/26, 51 y.o.   MRN: [id number]  HPI patient presents stating that her right heel has been hurting a lot and making it difficult to walk   Review of Systems     Objective:   Physical Exam Neurovascular status intact with muscle strength adequate and moderate equinus noted. Patient's found to have inflammatory changes with pain plantar heel right at the insertional point of the tendon into the calcaneus    Assessment:     Plantar fasciitis right with inflammation fluid around the medial band    Plan:     H&P condition reviewed and injected the right plantar fascia 3 Milligan Kenalog 5 mill grams Xylocaine and advised on physical therapy supportive shoes and dispensed fascial brace with instructions for supporting the plantar arch and improving function  X-ray right indicated well-healing surgical site first metatarsal with minimal spur formation noted plantar heel

## 2017-01-19 ENCOUNTER — Other Ambulatory Visit (HOSPITAL_BASED_OUTPATIENT_CLINIC_OR_DEPARTMENT_OTHER): Payer: BC Managed Care – PPO

## 2017-01-19 ENCOUNTER — Ambulatory Visit (HOSPITAL_BASED_OUTPATIENT_CLINIC_OR_DEPARTMENT_OTHER): Payer: BC Managed Care – PPO | Admitting: Oncology

## 2017-01-19 VITALS — BP 116/90 | HR 76 | Temp 98.0°F | Resp 18 | Ht 66.0 in | Wt 158.0 lb

## 2017-01-19 DIAGNOSIS — C50812 Malignant neoplasm of overlapping sites of left female breast: Secondary | ICD-10-CM

## 2017-01-19 DIAGNOSIS — C50212 Malignant neoplasm of upper-inner quadrant of left female breast: Secondary | ICD-10-CM

## 2017-01-19 LAB — CBC WITH DIFFERENTIAL/PLATELET
BASO%: 0.8 % (ref 0.0–2.0)
BASOS ABS: 0.1 10*3/uL (ref 0.0–0.1)
EOS%: 1.7 % (ref 0.0–7.0)
Eosinophils Absolute: 0.1 10*3/uL (ref 0.0–0.5)
HCT: 42 % (ref 34.8–46.6)
HGB: 14.3 g/dL (ref 11.6–15.9)
LYMPH#: 2.1 10*3/uL (ref 0.9–3.3)
LYMPH%: 28.2 % (ref 14.0–49.7)
MCH: 32.8 pg (ref 25.1–34.0)
MCHC: 34.1 g/dL (ref 31.5–36.0)
MCV: 96 fL (ref 79.5–101.0)
MONO#: 0.4 10*3/uL (ref 0.1–0.9)
MONO%: 5.8 % (ref 0.0–14.0)
NEUT#: 4.7 10*3/uL (ref 1.5–6.5)
NEUT%: 63.5 % (ref 38.4–76.8)
Platelets: 204 10*3/uL (ref 145–400)
RBC: 4.37 10*6/uL (ref 3.70–5.45)
RDW: 12.9 % (ref 11.2–14.5)
WBC: 7.4 10*3/uL (ref 3.9–10.3)

## 2017-01-19 LAB — COMPREHENSIVE METABOLIC PANEL
ALBUMIN: 3.8 g/dL (ref 3.5–5.0)
ALK PHOS: 80 U/L (ref 40–150)
ALT: 20 U/L (ref 0–55)
AST: 24 U/L (ref 5–34)
Anion Gap: 11 mEq/L (ref 3–11)
BILIRUBIN TOTAL: 0.34 mg/dL (ref 0.20–1.20)
BUN: 15.1 mg/dL (ref 7.0–26.0)
CALCIUM: 9.6 mg/dL (ref 8.4–10.4)
CO2: 28 mEq/L (ref 22–29)
Chloride: 105 mEq/L (ref 98–109)
Creatinine: 0.8 mg/dL (ref 0.6–1.1)
EGFR: 84 mL/min/{1.73_m2} — AB (ref >90)
GLUCOSE: 85 mg/dL (ref 70–140)
Potassium: 4 mEq/L (ref 3.5–5.1)
SODIUM: 143 meq/L (ref 136–145)
Total Protein: 7.3 g/dL (ref 6.4–8.3)
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
## 2017-01-20 LAB — VITAMIN D 25 HYDROXY (VIT D DEFICIENCY, FRACTURES): VIT D 25 HYDROXY: 84.2 ng/mL (ref 30.0–100.0)

## 2017-01-20 LAB — FOLLICLE STIMULATING HORMONE: FSH: 27.7 m[IU]/mL

## 2017-01-21 LAB — ESTRADIOL, ULTRA SENS: ESTRADIOL, SENSITIVE: 5.2 pg/mL

## 2017-02-01 ENCOUNTER — Encounter: Payer: Self-pay | Admitting: Podiatry

## 2017-02-01 ENCOUNTER — Ambulatory Visit (INDEPENDENT_AMBULATORY_CARE_PROVIDER_SITE_OTHER): Payer: BC Managed Care – PPO | Admitting: Podiatry

## 2017-02-01 DIAGNOSIS — M722 Plantar fascial fibromatosis: Secondary | ICD-10-CM | POA: Diagnosis not present

## 2017-02-01 NOTE — Progress Notes (Signed)
Subjective:    Patient ID: Kristin Spears, female   DOB: 51 y.o.   MRN: [id number]   HPI patient presents stating my right heel is improving so much with minimal discomfort and the brace is really helping    ROS      Objective:  Physical Exam Neurovascular status intact negative Homans sign was noted with patient's right foot showing significant improvement in the medial fascial band at the insertion tendon into the calcaneus    Assessment:      Plantar fasciitis right inflammation fluid buildup that has improved quite dramatically    Plan:    Reviewed continued physical therapy anti-inflammatories supportive shoe gear usage and patient is discharged and will be seen back as needed

## 2017-02-12 ENCOUNTER — Other Ambulatory Visit: Payer: Self-pay | Admitting: Oncology

## 2017-02-22 ENCOUNTER — Ambulatory Visit: Payer: BC Managed Care – PPO | Admitting: Podiatry

## 2017-02-24 ENCOUNTER — Ambulatory Visit: Payer: BC Managed Care – PPO | Admitting: Podiatry

## 2017-07-01 ENCOUNTER — Other Ambulatory Visit: Payer: Self-pay | Admitting: General Surgery

## 2017-07-01 DIAGNOSIS — R590 Localized enlarged lymph nodes: Secondary | ICD-10-CM

## 2017-07-07 ENCOUNTER — Other Ambulatory Visit (HOSPITAL_COMMUNITY): Payer: Self-pay | Admitting: Internal Medicine

## 2017-07-07 DIAGNOSIS — R52 Pain, unspecified: Secondary | ICD-10-CM

## 2017-07-08 ENCOUNTER — Ambulatory Visit (HOSPITAL_COMMUNITY)
Admission: RE | Admit: 2017-07-08 | Discharge: 2017-07-08 | Disposition: A | Discharge status: Home / Self Care | Payer: BC Managed Care – PPO | Source: Ambulatory Visit | Attending: Internal Medicine | Admitting: Internal Medicine

## 2017-07-08 DIAGNOSIS — M79604 Pain in right leg: Secondary | ICD-10-CM | POA: Insufficient documentation

## 2017-07-08 DIAGNOSIS — R52 Pain, unspecified: Secondary | ICD-10-CM

## 2017-07-08 NOTE — Progress Notes (Signed)
Preliminary results by tech - Right Lower Ext. Venous Duplex Completed. Negative for deep and superficial vein thrombosis. Yerania Chamorro, BS, RDMS, RVT  

## 2017-07-11 ENCOUNTER — Other Ambulatory Visit: Payer: Self-pay | Admitting: Oncology

## 2017-07-12 ENCOUNTER — Ambulatory Visit
Admission: RE | Admit: 2017-07-12 | Discharge: 2017-07-12 | Disposition: A | Discharge status: Home / Self Care | Payer: BC Managed Care – PPO | Source: Ambulatory Visit | Attending: General Surgery | Admitting: General Surgery

## 2017-07-12 DIAGNOSIS — R590 Localized enlarged lymph nodes: Secondary | ICD-10-CM

## 2017-10-06 ENCOUNTER — Encounter: Payer: Self-pay | Admitting: Oncology

## 2017-10-14 ENCOUNTER — Other Ambulatory Visit: Payer: Self-pay | Admitting: Oncology

## 2017-10-14 NOTE — Progress Notes (Signed)
[facility] showed osteopenia with a T score of -1.7

## 2017-10-18 ENCOUNTER — Encounter: Payer: Self-pay | Admitting: Oncology

## 2017-10-22 ENCOUNTER — Telehealth: Payer: Self-pay | Admitting: *Deleted

## 2017-10-22 MED ORDER — TAMOXIFEN CITRATE 20 MG PO TABS: 20.0000 mg | ORAL_TABLET | Freq: Every day | ORAL | 4 refills | Status: DC

## 2017-10-22 NOTE — Telephone Encounter (Signed)
This RN spoke with pt per her call stating concern due to noted low bone density per primary MD and plan to start  Anastrozole.  Bone density readings were sent via my chart and are being reviewed by MD for advice.  Per phone discussion Kristin Spears states she is concerned about using a medication that could lower her bone density further.  Plan at present is pt will continue on tamoxifen - and follow up with MD in May as scheduled unless post MD review he would want to see her before then.  Refill sent to verified pharmacy.

## 2017-10-25 ENCOUNTER — Other Ambulatory Visit: Payer: Self-pay | Admitting: Oncology

## 2017-10-25 NOTE — Progress Notes (Signed)
Kristin Spears called expressing reluctance to start anastrozole given the fact that she has "low bone density".  I do not know what the results of her bone density are.  The concern however is very valid.  Since she completed 5 years of tamoxifen, I think it is fine for her to hold off for now and then come see me in a month or so and discuss these options further  I left her a phone message since there was no answer when I called phone message since there was no answer when I called

## 2017-10-26 ENCOUNTER — Other Ambulatory Visit: Payer: Self-pay | Admitting: Oncology

## 2017-10-26 MED ORDER — TAMOXIFEN CITRATE 20 MG PO TABS: 20.0000 mg | ORAL_TABLET | Freq: Every day | ORAL | 4 refills | Status: DC

## 2017-10-28 ENCOUNTER — Encounter: Payer: Self-pay | Admitting: Oncology

## 2017-11-19 NOTE — Progress Notes (Signed)
This encounter was created in error - please disregard.

## 2018-01-14 ENCOUNTER — Inpatient Hospital Stay (HOSPITAL_COMMUNITY)
Admission: AD | Admit: 2018-01-14 | Discharge: 2018-01-14 | Disposition: A | Discharge status: Home / Self Care | Payer: BC Managed Care – PPO | Source: Ambulatory Visit | Attending: Obstetrics and Gynecology | Admitting: Obstetrics and Gynecology

## 2018-01-14 ENCOUNTER — Encounter (HOSPITAL_COMMUNITY): Payer: Self-pay | Admitting: *Deleted

## 2018-01-14 DIAGNOSIS — N939 Abnormal uterine and vaginal bleeding, unspecified: Secondary | ICD-10-CM

## 2018-01-14 HISTORY — DX: Hypothyroidism, unspecified: E03.9

## 2018-01-14 LAB — URINALYSIS, ROUTINE W REFLEX MICROSCOPIC

## 2018-01-14 LAB — URINALYSIS, MICROSCOPIC (REFLEX)

## 2018-01-14 NOTE — Discharge Instructions (Signed)
-call on Monday for a follow-up; will have endometrial biopsy and [place] with regular doctor  Endometrial Biopsy Endometrial biopsy is a procedure in which a tissue sample is taken from inside the uterus. The sample is taken from the endometrium, which is the lining of the uterus. The tissue sample is then checked under a microscope to see if the tissue is normal or abnormal. This procedure helps to determine where you are in your menstrual cycle and how hormone levels are affecting the lining of the uterus. This procedure may also be used to evaluate uterine bleeding or to diagnose endometrial cancer, endometrial tuberculosis, polyps, or other inflammatory conditions. Tell a health care provider about:  Any allergies you have.  All medicines you are taking, including vitamins, herbs, eye drops, creams, and over-the-counter medicines.  Any problems you or family members have had with anesthetic medicines.  Any blood disorders you have.  Any surgeries you have had.  Any medical conditions you have.  Whether you are pregnant or may be pregnant. What are the risks? Generally, this is a safe procedure. However, problems may occur, including:  Bleeding.  Pelvic infection.  Puncture of the wall of the uterus with the biopsy device (rare).  What happens before the procedure?  Keep a record of your menstrual cycles as told by your health care provider. You may need to schedule your procedure for a specific time in your cycle.  You may want to bring a sanitary pad to wear after the procedure.  Ask your health care provider about: ? Changing or stopping your regular medicines. This is especially important if you are taking diabetes medicines or blood thinners. ? Taking medicines such as aspirin and ibuprofen. These medicines can thin your blood. Do not take these medicines before your procedure if your health care provider instructs you not to.  Plan to have someone take you home from the  hospital or clinic. What happens during the procedure?  To lower your risk of infection: ? Your health care team will wash or sanitize their hands.  You will lie on an exam table with your feet and legs supported as in a pelvic exam.  Your health care provider will insert an instrument (speculum) into your vagina to see your cervix.  Your cervix will be cleansed with an antiseptic solution.  A medicine (local anesthetic) will be used to numb the cervix.  A forceps instrument (tenaculum) will be used to hold your cervix steady for the biopsy.  A thin, rod-like instrument (uterine sound) will be inserted through your cervix to determine the length of your uterus and the location where the biopsy sample will be removed.  A thin, flexible tube (catheter) will be inserted through your cervix and into the uterus. The catheter will be used to collect the biopsy sample from your endometrial tissue.  The catheter and speculum will then be removed, and the tissue sample will be sent to a lab for examination. What happens after the procedure?  You will rest in a recovery area until you are ready to go home.  You may have mild cramping and a small amount of vaginal bleeding. This is normal.  It is up to you to get the results of your procedure. Ask your health care provider, or the department that is doing the procedure, when your results will be ready. Summary  Endometrial biopsy is a procedure in which a tissue sample is taken from the endometrium, which is the lining of the uterus.  This procedure may help to diagnose menstrual cycle problems, abnormal bleeding, or other conditions affecting the endometrium.  Before the procedure, keep a record of your menstrual cycles as told by your health care provider.  The tissue sample that is removed will be checked under a microscope to see if it is normal or abnormal. This information is not intended to replace advice given to you by your health  care provider. Make sure you discuss any questions you have with your health care provider. Document Released: 01/22/2005 Document Revised: 10/07/2016 Document Reviewed: 10/07/2016 Elsevier Interactive Patient Education  2017 [employer].
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
## 2018-01-14 NOTE — MAU Note (Signed)
Pt reports she is post menopausal x 2 years. Stared having vaginal bleeding yesterday. Mild abd cramping.

## 2018-02-08 NOTE — Progress Notes (Signed)
No show

## 2018-02-15 ENCOUNTER — Encounter: Payer: Self-pay | Admitting: Oncology

## 2018-02-15 ENCOUNTER — Inpatient Hospital Stay: Payer: BC Managed Care – PPO | Attending: Oncology | Admitting: Oncology

## 2018-02-15 DIAGNOSIS — C50212 Malignant neoplasm of upper-inner quadrant of left female breast: Secondary | ICD-10-CM

## 2018-02-22 ENCOUNTER — Inpatient Hospital Stay: Payer: BC Managed Care – PPO

## 2018-03-08 ENCOUNTER — Telehealth: Payer: Self-pay | Admitting: Oncology

## 2018-03-08 NOTE — Telephone Encounter (Signed)
Tried to call patient regarding voicmail °

## 2018-03-11 ENCOUNTER — Telehealth: Payer: Self-pay

## 2018-03-11 NOTE — Telephone Encounter (Signed)
Patient stopped by to schedule appointment. Called RN and left a msg. Per 6/7 walk-in.

## 2018-03-14 ENCOUNTER — Telehealth: Payer: Self-pay | Admitting: *Deleted

## 2018-03-14 NOTE — Telephone Encounter (Signed)
Pt called to r/s appt with Dr. Jana Hakim. Scheduled and confirmed appt on 03/15/18 at 3pm. Denies further needs at this time.
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
## 2018-03-15 ENCOUNTER — Inpatient Hospital Stay: Payer: BC Managed Care – PPO | Attending: Oncology

## 2018-03-15 ENCOUNTER — Inpatient Hospital Stay (HOSPITAL_BASED_OUTPATIENT_CLINIC_OR_DEPARTMENT_OTHER): Payer: BC Managed Care – PPO | Admitting: Oncology

## 2018-03-15 VITALS — BP 118/61 | HR 83 | Temp 98.6°F | Resp 18 | Ht 66.0 in | Wt 157.9 lb

## 2018-03-15 DIAGNOSIS — Z853 Personal history of malignant neoplasm of breast: Secondary | ICD-10-CM | POA: Diagnosis not present

## 2018-03-15 DIAGNOSIS — C50212 Malignant neoplasm of upper-inner quadrant of left female breast: Secondary | ICD-10-CM

## 2018-03-15 DIAGNOSIS — Z9013 Acquired absence of bilateral breasts and nipples: Secondary | ICD-10-CM | POA: Diagnosis not present

## 2018-03-15 DIAGNOSIS — Z170 Estrogen receptor positive status [ER+]: Secondary | ICD-10-CM

## 2018-03-15 DIAGNOSIS — Z9223 Personal history of estrogen therapy: Secondary | ICD-10-CM

## 2018-03-15 LAB — CMP (CANCER CENTER ONLY)
ALT: 13 U/L (ref 0–55)
AST: 20 U/L (ref 5–34)
Albumin: 3.9 g/dL (ref 3.5–5.0)
Alkaline Phosphatase: 73 U/L (ref 40–150)
Anion gap: 8 (ref 3–11)
BUN: 17 mg/dL (ref 7–26)
CHLORIDE: 103 mmol/L (ref 98–109)
CO2: 29 mmol/L (ref 22–29)
CREATININE: 0.92 mg/dL (ref 0.60–1.10)
Calcium: 9.4 mg/dL (ref 8.4–10.4)
GFR, Estimated: >60 mL/min (ref >60)
Glucose, Bld: 135 mg/dL (ref 70–140)
Potassium: 3.6 mmol/L (ref 3.5–5.1)
SODIUM: 140 mmol/L (ref 136–145)
Total Bilirubin: 0.3 mg/dL (ref 0.2–1.2)
Total Protein: 7.2 g/dL (ref 6.4–8.3)

## 2018-03-15 LAB — CBC WITH DIFFERENTIAL/PLATELET
Basophils Absolute: 0 10*3/uL (ref 0.0–0.1)
Basophils Relative: 0 %
EOS ABS: 0.3 10*3/uL (ref 0.0–0.5)
EOS PCT: 4 %
HCT: 41.3 % (ref 34.8–46.6)
Hemoglobin: 14 g/dL (ref 11.6–15.9)
Lymphocytes Relative: 33 %
Lymphs Abs: 2.4 10*3/uL (ref 0.9–3.3)
MCH: 33.2 pg (ref 25.1–34.0)
MCHC: 33.9 g/dL (ref 31.5–36.0)
MCV: 97.9 fL (ref 79.5–101.0)
MONO ABS: 0.4 10*3/uL (ref 0.1–0.9)
Monocytes Relative: 5 %
Neutro Abs: 4.2 10*3/uL (ref 1.5–6.5)
Neutrophils Relative %: 58 %
PLATELETS: 190 10*3/uL (ref 145–400)
RBC: 4.22 MIL/uL (ref 3.70–5.45)
RDW: 12.9 % (ref 11.2–14.5)
WBC: 7.3 10*3/uL (ref 3.9–10.3)

## 2018-03-16 LAB — FOLLICLE STIMULATING HORMONE: FSH: 28.7 m[IU]/mL

## 2018-03-17 LAB — ESTRADIOL, ULTRA SENS: Estradiol, Sensitive: 4.1 pg/mL

## 2018-07-20 ENCOUNTER — Other Ambulatory Visit: Payer: BC Managed Care – PPO

## 2018-07-20 ENCOUNTER — Ambulatory Visit: Payer: BC Managed Care – PPO | Admitting: Oncology

## 2018-11-17 ENCOUNTER — Encounter: Payer: Self-pay | Admitting: Oncology

## 2018-12-13 ENCOUNTER — Other Ambulatory Visit: Payer: Self-pay | Admitting: Oncology

## 2019-05-04 ENCOUNTER — Encounter: Payer: Self-pay | Admitting: Oncology

## 2019-05-08 ENCOUNTER — Other Ambulatory Visit: Payer: Self-pay

## 2019-05-08 ENCOUNTER — Other Ambulatory Visit: Payer: BC Managed Care – PPO

## 2019-05-08 ENCOUNTER — Telehealth: Payer: Self-pay | Admitting: Oncology

## 2019-05-08 ENCOUNTER — Encounter: Payer: Self-pay | Admitting: Oncology

## 2019-05-08 ENCOUNTER — Inpatient Hospital Stay: Payer: BC Managed Care – PPO | Attending: Oncology | Admitting: Oncology

## 2019-05-08 VITALS — BP 112/71 | HR 88 | Temp 98.2°F | Resp 18 | Ht 66.0 in | Wt 155.8 lb

## 2019-05-08 DIAGNOSIS — C50212 Malignant neoplasm of upper-inner quadrant of left female breast: Secondary | ICD-10-CM

## 2019-05-08 DIAGNOSIS — Z170 Estrogen receptor positive status [ER+]: Secondary | ICD-10-CM | POA: Diagnosis not present

## 2019-05-08 DIAGNOSIS — Z9013 Acquired absence of bilateral breasts and nipples: Secondary | ICD-10-CM | POA: Diagnosis not present

## 2019-05-08 DIAGNOSIS — Z79810 Long term (current) use of selective estrogen receptor modulators (SERMs): Secondary | ICD-10-CM | POA: Insufficient documentation

## 2019-05-08 DIAGNOSIS — C50112 Malignant neoplasm of central portion of left female breast: Secondary | ICD-10-CM | POA: Insufficient documentation

## 2019-05-08 MED ORDER — TAMOXIFEN CITRATE 20 MG PO TABS: 20.0000 mg | ORAL_TABLET | Freq: Every day | ORAL | 4 refills | Status: DC

## 2019-05-08 NOTE — Telephone Encounter (Signed)
Confirmed with patient lab/fu for August 2021.
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
## 2019-07-25 ENCOUNTER — Other Ambulatory Visit: Payer: Self-pay | Admitting: Obstetrics and Gynecology

## 2019-07-25 DIAGNOSIS — N6320 Unspecified lump in the left breast, unspecified quadrant: Secondary | ICD-10-CM

## 2019-07-26 ENCOUNTER — Ambulatory Visit
Admission: RE | Admit: 2019-07-26 | Discharge: 2019-07-26 | Disposition: A | Discharge status: Home / Self Care | Payer: BC Managed Care – PPO | Source: Ambulatory Visit | Attending: Obstetrics and Gynecology | Admitting: Obstetrics and Gynecology

## 2019-07-26 ENCOUNTER — Encounter: Payer: Self-pay | Admitting: Oncology

## 2019-07-26 ENCOUNTER — Other Ambulatory Visit: Payer: Self-pay

## 2019-07-26 DIAGNOSIS — N6320 Unspecified lump in the left breast, unspecified quadrant: Secondary | ICD-10-CM

## 2019-07-26 MED ORDER — ANASTROZOLE 1 MG PO TABS: 1.0000 mg | ORAL_TABLET | Freq: Every day | ORAL | 2 refills | Status: DC

## 2019-07-26 NOTE — Progress Notes (Signed)
Per MyChart messages:  RN reviewed with MD.    Recommendations to discontinue Tamoxifen due to Endometrial Hyperplasia.  Patient will hold X 1 month.  Start Anastrozole 1mg  daily.  Follow up with MD in 2-3 months.

## 2019-07-27 ENCOUNTER — Telehealth: Payer: Self-pay | Admitting: Oncology

## 2019-07-27 NOTE — Telephone Encounter (Signed)
Scheduled appt per 10/21 sch message -- pt is aware of appt date and time   

## 2019-08-06 DIAGNOSIS — K829 Disease of gallbladder, unspecified: Secondary | ICD-10-CM | POA: Insufficient documentation

## 2019-10-05 ENCOUNTER — Encounter: Payer: Self-pay | Admitting: Oncology

## 2019-10-20 ENCOUNTER — Encounter: Payer: Self-pay | Admitting: Oncology

## 2019-10-20 ENCOUNTER — Encounter: Payer: Self-pay | Admitting: *Deleted
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
## 2019-10-24 ENCOUNTER — Telehealth (HOSPITAL_BASED_OUTPATIENT_CLINIC_OR_DEPARTMENT_OTHER): Payer: BC Managed Care – PPO | Admitting: Oncology

## 2019-10-24 DIAGNOSIS — C50212 Malignant neoplasm of upper-inner quadrant of left female breast: Secondary | ICD-10-CM

## 2019-10-26 ENCOUNTER — Encounter: Payer: Self-pay | Admitting: Oncology

## 2019-11-16 ENCOUNTER — Other Ambulatory Visit: Payer: Self-pay | Admitting: Oncology

## 2019-11-17 ENCOUNTER — Encounter: Payer: Self-pay | Admitting: Oncology

## 2019-12-03 ENCOUNTER — Ambulatory Visit: Payer: BC Managed Care – PPO | Attending: Internal Medicine

## 2019-12-03 DIAGNOSIS — Z23 Encounter for immunization: Secondary | ICD-10-CM

## 2019-12-03 NOTE — Progress Notes (Signed)
   Covid-19 Vaccination Clinic  Name:  Kristin Spears    MRN: [id number] DOB: [birth year]  12/03/2019  [name] was observed post Covid-19 immunization for 30 minutes based on pre-vaccination screening without incidence. She was provided with Vaccine Information Sheet and instruction to access the V-Safe system.   [name] was instructed to call 911 with any severe reactions post vaccine: Marland Kitchen Difficulty breathing  . Swelling of your face and throat  . A fast heartbeat  . A bad rash all over your body  . Dizziness and weakness    Immunizations Administered    Name Date Dose VIS Date Route   Pfizer COVID-19 Vaccine 12/03/2019  8:36 AM 0.3 mL 09/15/2019 Intramuscular   Manufacturer: [place]   Lot: [id number]   [place]: [id number]

## 2019-12-23 ENCOUNTER — Ambulatory Visit: Payer: BC Managed Care – PPO | Attending: Internal Medicine

## 2019-12-23 DIAGNOSIS — Z23 Encounter for immunization: Secondary | ICD-10-CM

## 2019-12-23 NOTE — Progress Notes (Signed)
   Covid-19 Vaccination Clinic  Name:  Kristin Spears    MRN: [id number] DOB: 1965/11/29  12/23/2019  Ms. Basic was observed post Covid-19 immunization for 30 minutes based on pre-vaccination screening without incident. She was provided with Vaccine Information Sheet and instruction to access the V-Safe system.   [name] was instructed to call 911 with any severe reactions post vaccine: Marland Kitchen Difficulty breathing  . Swelling of face and throat  . A fast heartbeat  . A bad rash all over body  . Dizziness and weakness   Immunizations Administered    Name Date Dose VIS Date Route   Pfizer COVID-19 Vaccine 12/23/2019 10:26 AM 0.3 mL 09/15/2019 Intramuscular   Manufacturer: [place]   Lot: [id number]   [place]: [id number]

## 2020-02-10 ENCOUNTER — Other Ambulatory Visit: Payer: Self-pay | Admitting: Oncology

## 2020-02-20 ENCOUNTER — Encounter: Payer: Self-pay | Admitting: Oncology

## 2020-03-25 ENCOUNTER — Encounter: Payer: Self-pay | Admitting: Oncology

## 2020-05-09 ENCOUNTER — Other Ambulatory Visit: Payer: BC Managed Care – PPO

## 2020-05-09 ENCOUNTER — Encounter: Payer: Self-pay | Admitting: Oncology

## 2020-05-09 ENCOUNTER — Ambulatory Visit: Payer: BC Managed Care – PPO | Admitting: Oncology

## 2020-05-10 ENCOUNTER — Other Ambulatory Visit: Payer: Self-pay | Admitting: Oncology

## 2020-06-12 ENCOUNTER — Ambulatory Visit: Payer: Self-pay | Admitting: General Surgery

## 2020-07-09 ENCOUNTER — Inpatient Hospital Stay: Payer: BC Managed Care – PPO | Attending: Oncology | Admitting: Oncology

## 2020-07-09 ENCOUNTER — Other Ambulatory Visit: Payer: Self-pay

## 2020-07-09 ENCOUNTER — Inpatient Hospital Stay: Payer: BC Managed Care – PPO

## 2020-07-09 VITALS — BP 124/74 | HR 78 | Temp 97.4°F | Resp 18 | Ht 66.0 in | Wt 153.4 lb

## 2020-07-09 DIAGNOSIS — Z8042 Family history of malignant neoplasm of prostate: Secondary | ICD-10-CM | POA: Insufficient documentation

## 2020-07-09 DIAGNOSIS — Z170 Estrogen receptor positive status [ER+]: Secondary | ICD-10-CM | POA: Diagnosis not present

## 2020-07-09 DIAGNOSIS — Z9013 Acquired absence of bilateral breasts and nipples: Secondary | ICD-10-CM | POA: Insufficient documentation

## 2020-07-09 DIAGNOSIS — Z79811 Long term (current) use of aromatase inhibitors: Secondary | ICD-10-CM | POA: Diagnosis not present

## 2020-07-09 DIAGNOSIS — Z803 Family history of malignant neoplasm of breast: Secondary | ICD-10-CM | POA: Insufficient documentation

## 2020-07-09 DIAGNOSIS — C50212 Malignant neoplasm of upper-inner quadrant of left female breast: Secondary | ICD-10-CM | POA: Insufficient documentation

## 2020-07-09 DIAGNOSIS — I10 Essential (primary) hypertension: Secondary | ICD-10-CM | POA: Insufficient documentation

## 2020-07-09 DIAGNOSIS — Z8249 Family history of ischemic heart disease and other diseases of the circulatory system: Secondary | ICD-10-CM | POA: Diagnosis not present

## 2020-07-09 DIAGNOSIS — Z79899 Other long term (current) drug therapy: Secondary | ICD-10-CM | POA: Insufficient documentation

## 2020-07-09 DIAGNOSIS — E039 Hypothyroidism, unspecified: Secondary | ICD-10-CM | POA: Insufficient documentation

## 2020-07-09 LAB — CBC WITH DIFFERENTIAL/PLATELET
Abs Immature Granulocytes: 0.01 10*3/uL (ref 0.00–0.07)
Basophils Absolute: 0 10*3/uL (ref 0.0–0.1)
Basophils Relative: 1 %
Eosinophils Absolute: 0.1 10*3/uL (ref 0.0–0.5)
Eosinophils Relative: 2 %
HCT: 40.6 % (ref 36.0–46.0)
Hemoglobin: 13.6 g/dL (ref 12.0–15.0)
Immature Granulocytes: 0 %
Lymphocytes Relative: 43 %
Lymphs Abs: 2.8 10*3/uL (ref 0.7–4.0)
MCH: 31.6 pg (ref 26.0–34.0)
MCHC: 33.5 g/dL (ref 30.0–36.0)
MCV: 94.2 fL (ref 80.0–100.0)
Monocytes Absolute: 0.4 10*3/uL (ref 0.1–1.0)
Monocytes Relative: 6 %
Neutro Abs: 3.2 10*3/uL (ref 1.7–7.7)
Neutrophils Relative %: 48 %
Platelets: 224 10*3/uL (ref 150–400)
RBC: 4.31 MIL/uL (ref 3.87–5.11)
RDW: 12.2 % (ref 11.5–15.5)
WBC: 6.6 10*3/uL (ref 4.0–10.5)
nRBC: 0 % (ref 0.0–0.2)

## 2020-07-09 LAB — COMPREHENSIVE METABOLIC PANEL
ALT: 94 U/L — ABNORMAL HIGH (ref 0–44)
AST: 79 U/L — ABNORMAL HIGH (ref 15–41)
Albumin: 4 g/dL (ref 3.5–5.0)
Alkaline Phosphatase: 97 U/L (ref 38–126)
Anion gap: 8 (ref 5–15)
BUN: 11 mg/dL (ref 6–20)
CO2: 30 mmol/L (ref 22–32)
Calcium: 9.5 mg/dL (ref 8.9–10.3)
Chloride: 103 mmol/L (ref 98–111)
Creatinine, Ser: 0.82 mg/dL (ref 0.44–1.00)
GFR calc non Af Amer: >60 mL/min (ref >60)
Glucose, Bld: 90 mg/dL (ref 70–99)
Potassium: 3.5 mmol/L (ref 3.5–5.1)
Sodium: 141 mmol/L (ref 135–145)
Total Bilirubin: 0.6 mg/dL (ref 0.3–1.2)
Total Protein: 7.4 g/dL (ref 6.5–8.1)

## 2020-07-09 MED ORDER — ANASTROZOLE 1 MG PO TABS: ORAL_TABLET | ORAL | 4 refills | Status: DC
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
## 2020-07-10 ENCOUNTER — Encounter: Payer: Self-pay | Admitting: Oncology

## 2020-07-17 NOTE — Progress Notes (Signed)
[facility] [phone] - [place], [place] [address] [place] [address] Phone: [phone] Fax: [phone]      Your procedure is scheduled on October 20  Report to [facility] Main Entrance "A" at 0900 A.M., and check in at the Admitting office.  Call this number if you have problems the morning of surgery:  [phone]  Call [phone] if you have any questions prior to your surgery date Monday-Friday 8am-4pm    Remember:  Do not eat after midnight the night before your surgery  You may drink clear liquids until 0800 am the morning of your surgery.   Clear liquids allowed are: Water, Non-Citrus Juices (without pulp), Carbonated Beverages, Clear Tea, Black Coffee Only, and Gatorade    Take these medicines the morning of surgery with A SIP OF WATER  anastrozole (ARIMIDEX) levothyroxine (SYNTHROID)  As of today, STOP taking any Aspirin (unless otherwise instructed by your surgeon) Aleve, Naproxen, Ibuprofen, Motrin, Advil, Goody's, BC's, all herbal medications, fish oil, and all vitamins.                      Do not wear jewelry, make up, or nail polish            Do not wear lotions, powders, perfumes, or deodorant.            Do not shave 48 hours prior to surgery.              Do not bring valuables to the hospital.            [facility] is not responsible for any belongings or valuables.  Do NOT Smoke (Tobacco/Vaping) or drink Alcohol 24 hours prior to your procedure If you use a CPAP at night, you may bring all equipment for your overnight stay.   Contacts, glasses, dentures or bridgework may not be worn into surgery.      For patients admitted to the hospital, discharge time will be determined by your treatment team.   Patients discharged the day of surgery will not be allowed to drive home, and someone needs to stay with them for 24 hours.    Special instructions:   [facility]- Preparing For Surgery  Before surgery, you can play an important role. Because skin is not sterile, your skin needs to be as free of germs as possible. You can reduce the number of germs on your skin by washing with CHG (chlorahexidine gluconate) Soap before surgery.  CHG is an antiseptic cleaner which kills germs and bonds with the skin to continue killing germs even after washing.    Oral Hygiene is also important to reduce your risk of infection.  Remember - BRUSH YOUR TEETH THE MORNING OF SURGERY WITH YOUR REGULAR TOOTHPASTE  Please do not use if you have an allergy to CHG or antibacterial soaps. If your skin becomes reddened/irritated stop using the CHG.  Do not shave (including legs and underarms) for at least 48 hours prior to first CHG shower. It is OK to shave your face.  Please follow these instructions carefully.   1. Shower the NIGHT BEFORE SURGERY and the MORNING OF SURGERY with CHG Soap.   2. If you chose to wash your hair, wash your hair first as usual with your normal shampoo.  3. After you shampoo, rinse your hair and body thoroughly to remove the shampoo.  4. Use CHG as you would  any other liquid soap. You can apply CHG directly to the skin and wash gently with a scrungie or a clean washcloth.   5. Apply the CHG Soap to your body ONLY FROM THE NECK DOWN.  Do not use on open wounds or open sores. Avoid contact with your eyes, ears, mouth and genitals (private parts). Wash Face and genitals (private parts)  with your normal soap.   6. Wash thoroughly, paying special attention to the area where your surgery will be performed.  7. Thoroughly rinse your body with warm water from the neck down.  8. DO NOT shower/wash with your normal soap after using and rinsing off the CHG Soap.  9. Pat yourself dry with a CLEAN TOWEL.  10. Wear CLEAN PAJAMAS to bed the night before surgery  11. Place CLEAN SHEETS on your bed the night of your first shower and DO NOT SLEEP WITH  PETS.   Day of Surgery: Wear Clean/Comfortable clothing the morning of surgery Do not apply any deodorants/lotions.   Remember to brush your teeth WITH YOUR REGULAR TOOTHPASTE.   Please read over the following fact sheets that you were given.

## 2020-07-18 ENCOUNTER — Encounter (HOSPITAL_COMMUNITY)
Admission: RE | Admit: 2020-07-18 | Discharge: 2020-07-18 | Disposition: A | Discharge status: Home / Self Care | Payer: BC Managed Care – PPO | Source: Ambulatory Visit | Attending: General Surgery | Admitting: General Surgery

## 2020-07-18 ENCOUNTER — Other Ambulatory Visit: Payer: Self-pay

## 2020-07-18 ENCOUNTER — Encounter (HOSPITAL_COMMUNITY): Payer: Self-pay

## 2020-07-18 DIAGNOSIS — Z01812 Encounter for preprocedural laboratory examination: Secondary | ICD-10-CM | POA: Diagnosis not present

## 2020-07-18 DIAGNOSIS — Z9882 Breast implant status: Secondary | ICD-10-CM | POA: Insufficient documentation

## 2020-07-18 DIAGNOSIS — F419 Anxiety disorder, unspecified: Secondary | ICD-10-CM | POA: Insufficient documentation

## 2020-07-18 DIAGNOSIS — F32A Depression, unspecified: Secondary | ICD-10-CM | POA: Insufficient documentation

## 2020-07-18 DIAGNOSIS — Z853 Personal history of malignant neoplasm of breast: Secondary | ICD-10-CM | POA: Diagnosis not present

## 2020-07-18 DIAGNOSIS — E039 Hypothyroidism, unspecified: Secondary | ICD-10-CM | POA: Diagnosis not present

## 2020-07-18 DIAGNOSIS — Z79899 Other long term (current) drug therapy: Secondary | ICD-10-CM | POA: Diagnosis not present

## 2020-07-18 DIAGNOSIS — F909 Attention-deficit hyperactivity disorder, unspecified type: Secondary | ICD-10-CM | POA: Insufficient documentation

## 2020-07-18 DIAGNOSIS — Z79890 Hormone replacement therapy (postmenopausal): Secondary | ICD-10-CM | POA: Diagnosis not present

## 2020-07-18 DIAGNOSIS — K8080 Other cholelithiasis without obstruction: Secondary | ICD-10-CM | POA: Diagnosis not present

## 2020-07-18 DIAGNOSIS — Z62810 Personal history of physical and sexual abuse in childhood: Secondary | ICD-10-CM | POA: Insufficient documentation

## 2020-07-18 HISTORY — DX: Anxiety disorder, unspecified: F41.9

## 2020-07-18 HISTORY — DX: Depression, unspecified: F32.A

## 2020-07-18 LAB — COMPREHENSIVE METABOLIC PANEL
ALT: 26 U/L (ref 0–44)
AST: 28 U/L (ref 15–41)
Albumin: 4.2 g/dL (ref 3.5–5.0)
Alkaline Phosphatase: 84 U/L (ref 38–126)
Anion gap: 8 (ref 5–15)
BUN: 12 mg/dL (ref 6–20)
CO2: 29 mmol/L (ref 22–32)
Calcium: 9.7 mg/dL (ref 8.9–10.3)
Chloride: 105 mmol/L (ref 98–111)
Creatinine, Ser: 0.85 mg/dL (ref 0.44–1.00)
GFR, Estimated: >60 mL/min (ref >60)
Glucose, Bld: 90 mg/dL (ref 70–99)
Potassium: 4.3 mmol/L (ref 3.5–5.1)
Sodium: 142 mmol/L (ref 135–145)
Total Bilirubin: 0.3 mg/dL (ref 0.3–1.2)
Total Protein: 7.2 g/dL (ref 6.5–8.1)

## 2020-07-18 LAB — CBC
HCT: 44.3 % (ref 36.0–46.0)
Hemoglobin: 14.2 g/dL (ref 12.0–15.0)
MCH: 31.5 pg (ref 26.0–34.0)
MCHC: 32.1 g/dL (ref 30.0–36.0)
MCV: 98.2 fL (ref 80.0–100.0)
Platelets: 143 10*3/uL — ABNORMAL LOW (ref 150–400)
RBC: 4.51 MIL/uL (ref 3.87–5.11)
RDW: 12.6 % (ref 11.5–15.5)
WBC: 6.2 10*3/uL (ref 4.0–10.5)
nRBC: 0 % (ref 0.0–0.2)

## 2020-07-18 NOTE — Anesthesia Preprocedure Evaluation (Addendum)
Anesthesia Evaluation  Patient identified by MRN, date of birth, ID band  History of Anesthesia Complications (+) PONV and history of anesthetic complications  Airway Mallampati: I  TM Distance: >3 FB Neck ROM: Full    Dental no notable dental hx.    Pulmonary neg pulmonary ROS,    Pulmonary exam normal breath sounds clear to auscultation       Cardiovascular Exercise Tolerance: Good negative cardio ROS Normal cardiovascular exam Rhythm:Regular Rate:Normal     Neuro/Psych  Headaches, PSYCHIATRIC DISORDERS Anxiety Depression Mild cognitive dysfunction    GI/Hepatic negative GI ROS, Neg liver ROS,   Endo/Other  Hypothyroidism   Renal/GU negative Renal ROS  negative genitourinary   Musculoskeletal negative musculoskeletal ROS (+)   Abdominal Normal abdominal exam  (+)   Peds negative pediatric ROS (+)  Hematology negative hematology ROS (+)   Anesthesia Other Findings   Reproductive/Obstetrics negative OB ROS                             Anesthesia Physical Anesthesia Plan  ASA: II  Anesthesia Plan: General   Post-op Pain Management:    Induction: Intravenous  PONV Risk Score and Plan: 3 and Midazolam, Dexamethasone, Ondansetron and Treatment may vary due to age or medical condition  Airway Management Planned: Oral ETT  Additional Equipment:   Intra-op Plan:   Post-operative Plan: Extubation in OR  Informed Consent: I have reviewed the patients History and Physical, chart, labs and discussed the procedure including the risks, benefits and alternatives for the proposed anesthesia with the patient or authorized representative who has indicated his/her understanding and acceptance.       Plan Discussed with: Anesthesiologist and CRNA  Anesthesia Plan Comments: (PAT note written 07/18/2020 by Myra Gianotti, PA-C. )       Anesthesia Quick Evaluation

## 2020-07-18 NOTE — Progress Notes (Signed)
PCP -  Deland Pretty Cardiologist -  Dr. Verl Blalock 5 yrs ( tilt table procedure)   Chest x-ray - na EKG - na--per Shelby Dubin. PA Stress Test - na ECHO -  na Cardiac Cath - na  Sleep Study - na  Fasting Blood Sugar - na   Blood Thinner Instructions:  na Aspirin Instructions: na  ERAS Protcol -yes PRE-SURGERY Ensure not ordered  COVID TEST-  07/20/20   Anesthesia review: h/o syncopy Pt. Reports she is post menopausal,but has break through bleeding 1x a year in Aug. Pt. Is scheduled for D&C in Nov. 2021  Patient denies shortness of breath, fever, cough and chest pain at PAT appointment   All instructions explained to the patient, with a verbal understanding of the material. Patient agrees to go over the instructions while at home for a better understanding. Patient also instructed to self quarantine after being tested for COVID-19. The opportunity to ask questions was provided.
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
## 2020-07-20 ENCOUNTER — Other Ambulatory Visit (HOSPITAL_COMMUNITY)
Admission: RE | Admit: 2020-07-20 | Discharge: 2020-07-20 | Disposition: A | Discharge status: Home / Self Care | Payer: BC Managed Care – PPO | Source: Ambulatory Visit | Attending: General Surgery | Admitting: General Surgery

## 2020-07-20 DIAGNOSIS — Z01812 Encounter for preprocedural laboratory examination: Secondary | ICD-10-CM | POA: Insufficient documentation

## 2020-07-20 DIAGNOSIS — Z20822 Contact with and (suspected) exposure to covid-19: Secondary | ICD-10-CM | POA: Diagnosis not present

## 2020-07-21 LAB — SARS CORONAVIRUS 2 (TAT 6-24 HRS): SARS Coronavirus 2: NEGATIVE

## 2020-07-24 ENCOUNTER — Other Ambulatory Visit: Payer: Self-pay

## 2020-07-24 ENCOUNTER — Ambulatory Visit (HOSPITAL_COMMUNITY): Payer: BC Managed Care – PPO | Admitting: Certified Registered Nurse Anesthetist

## 2020-07-24 ENCOUNTER — Ambulatory Visit (HOSPITAL_COMMUNITY)
Admission: RE | Admit: 2020-07-24 | Discharge: 2020-07-24 | Disposition: A | Discharge status: Home / Self Care | Payer: BC Managed Care – PPO | Attending: General Surgery | Admitting: General Surgery

## 2020-07-24 ENCOUNTER — Encounter (HOSPITAL_COMMUNITY)
Admission: RE | Disposition: A | Discharge status: Home / Self Care | Payer: Self-pay | Source: Home / Self Care | Attending: General Surgery

## 2020-07-24 ENCOUNTER — Ambulatory Visit (HOSPITAL_COMMUNITY): Payer: BC Managed Care – PPO | Admitting: Vascular Surgery

## 2020-07-24 ENCOUNTER — Encounter (HOSPITAL_COMMUNITY): Payer: Self-pay | Admitting: General Surgery

## 2020-07-24 ENCOUNTER — Ambulatory Visit (HOSPITAL_COMMUNITY): Payer: BC Managed Care – PPO

## 2020-07-24 DIAGNOSIS — Z853 Personal history of malignant neoplasm of breast: Secondary | ICD-10-CM | POA: Insufficient documentation

## 2020-07-24 DIAGNOSIS — Z79899 Other long term (current) drug therapy: Secondary | ICD-10-CM | POA: Diagnosis not present

## 2020-07-24 DIAGNOSIS — Z885 Allergy status to narcotic agent status: Secondary | ICD-10-CM | POA: Insufficient documentation

## 2020-07-24 DIAGNOSIS — Z9011 Acquired absence of right breast and nipple: Secondary | ICD-10-CM | POA: Diagnosis not present

## 2020-07-24 DIAGNOSIS — Z888 Allergy status to other drugs, medicaments and biological substances status: Secondary | ICD-10-CM | POA: Diagnosis not present

## 2020-07-24 DIAGNOSIS — E039 Hypothyroidism, unspecified: Secondary | ICD-10-CM | POA: Diagnosis not present

## 2020-07-24 DIAGNOSIS — Z791 Long term (current) use of non-steroidal anti-inflammatories (NSAID): Secondary | ICD-10-CM | POA: Insufficient documentation

## 2020-07-24 DIAGNOSIS — Z419 Encounter for procedure for purposes other than remedying health state, unspecified: Secondary | ICD-10-CM

## 2020-07-24 DIAGNOSIS — Z880 Allergy status to penicillin: Secondary | ICD-10-CM | POA: Diagnosis not present

## 2020-07-24 DIAGNOSIS — Z79811 Long term (current) use of aromatase inhibitors: Secondary | ICD-10-CM | POA: Insufficient documentation

## 2020-07-24 DIAGNOSIS — K8010 Calculus of gallbladder with chronic cholecystitis without obstruction: Secondary | ICD-10-CM | POA: Diagnosis not present

## 2020-07-24 DIAGNOSIS — Z882 Allergy status to sulfonamides status: Secondary | ICD-10-CM | POA: Diagnosis not present

## 2020-07-24 HISTORY — PX: INTRAOPERATIVE CHOLANGIOGRAM: SHX5230

## 2020-07-24 HISTORY — PX: CHOLECYSTECTOMY: SHX55

## 2020-07-24 SURGERY — LAPAROSCOPIC CHOLECYSTECTOMY WITH INTRAOPERATIVE CHOLANGIOGRAM
Anesthesia: General | Site: Abdomen

## 2020-07-24 MED ORDER — CHLORHEXIDINE GLUCONATE CLOTH 2 % EX PADS: 6.0000 | MEDICATED_PAD | Freq: Once | CUTANEOUS | Status: DC

## 2020-07-24 MED ORDER — ROCURONIUM BROMIDE 10 MG/ML (PF) SYRINGE
PREFILLED_SYRINGE | INTRAVENOUS | Status: DC | PRN
  Administered 2020-07-24: 70 mg via INTRAVENOUS

## 2020-07-24 MED ORDER — PHENYLEPHRINE HCL (PRESSORS) 10 MG/ML IV SOLN
INTRAVENOUS | Status: DC | PRN
  Administered 2020-07-24: 40 ug via INTRAVENOUS

## 2020-07-24 MED ORDER — LACTATED RINGERS IV SOLN: INTRAVENOUS | Status: DC

## 2020-07-24 MED ORDER — ONDANSETRON HCL 4 MG/2ML IJ SOLN
INTRAMUSCULAR | Status: DC | PRN
  Administered 2020-07-24: 4 mg via INTRAVENOUS

## 2020-07-24 MED ORDER — 0.9 % SODIUM CHLORIDE (POUR BTL) OPTIME
TOPICAL | Status: DC | PRN
  Administered 2020-07-24: 1000 mL

## 2020-07-24 MED ORDER — GABAPENTIN 300 MG PO CAPS
300.0000 mg | ORAL_CAPSULE | ORAL | Status: AC
  Administered 2020-07-24: 300 mg via ORAL
  Filled 2020-07-24: qty 1

## 2020-07-24 MED ORDER — BUPIVACAINE HCL (PF) 0.25 % IJ SOLN
INTRAMUSCULAR | Status: AC
  Filled 2020-07-24: qty 30

## 2020-07-24 MED ORDER — PROMETHAZINE HCL 25 MG/ML IJ SOLN: 6.2500 mg | INTRAMUSCULAR | Status: DC | PRN

## 2020-07-24 MED ORDER — MIDAZOLAM HCL 2 MG/2ML IJ SOLN
INTRAMUSCULAR | Status: AC
  Filled 2020-07-24: qty 2

## 2020-07-24 MED ORDER — EPHEDRINE SULFATE 50 MG/ML IJ SOLN
INTRAMUSCULAR | Status: DC | PRN
  Administered 2020-07-24: 10 mg via INTRAVENOUS

## 2020-07-24 MED ORDER — PROPOFOL 10 MG/ML IV BOLUS
INTRAVENOUS | Status: DC | PRN
  Administered 2020-07-24: 150 mg via INTRAVENOUS
  Administered 2020-07-24: 20 mg via INTRAVENOUS

## 2020-07-24 MED ORDER — SODIUM CHLORIDE 0.9 % IV SOLN
INTRAVENOUS | Status: DC | PRN
  Administered 2020-07-24: 6 mL

## 2020-07-24 MED ORDER — MIDAZOLAM HCL 2 MG/2ML IJ SOLN
INTRAMUSCULAR | Status: DC | PRN
  Administered 2020-07-24: 2 mg via INTRAVENOUS

## 2020-07-24 MED ORDER — FENTANYL CITRATE (PF) 250 MCG/5ML IJ SOLN
INTRAMUSCULAR | Status: AC
  Filled 2020-07-24: qty 5

## 2020-07-24 MED ORDER — SCOPOLAMINE 1 MG/3DAYS TD PT72
MEDICATED_PATCH | TRANSDERMAL | Status: DC | PRN
  Administered 2020-07-24: 1 via TRANSDERMAL

## 2020-07-24 MED ORDER — HYDROMORPHONE HCL 1 MG/ML IJ SOLN
INTRAMUSCULAR | Status: AC
  Filled 2020-07-24: qty 0.5

## 2020-07-24 MED ORDER — TRAMADOL HCL 50 MG PO TABS: 50.0000 mg | ORAL_TABLET | Freq: Four times a day (QID) | ORAL | 1 refills | Status: DC | PRN

## 2020-07-24 MED ORDER — ORAL CARE MOUTH RINSE: 15.0000 mL | Freq: Once | OROMUCOSAL | Status: AC

## 2020-07-24 MED ORDER — HYDROMORPHONE HCL 1 MG/ML IJ SOLN
INTRAMUSCULAR | Status: DC | PRN
Start: 2020-07-24 — End: 2020-07-24
  Administered 2020-07-24: .5 mg via INTRAVENOUS

## 2020-07-24 MED ORDER — DIPHENHYDRAMINE HCL 50 MG/ML IJ SOLN
INTRAMUSCULAR | Status: DC | PRN
  Administered 2020-07-24: 12.5 mg via INTRAVENOUS

## 2020-07-24 MED ORDER — CIPROFLOXACIN IN D5W 400 MG/200ML IV SOLN
400.0000 mg | INTRAVENOUS | Status: AC
  Administered 2020-07-24: 400 mg via INTRAVENOUS
  Filled 2020-07-24: qty 200

## 2020-07-24 MED ORDER — AMISULPRIDE (ANTIEMETIC) 5 MG/2ML IV SOLN: 10.0000 mg | Freq: Once | INTRAVENOUS | Status: DC | PRN

## 2020-07-24 MED ORDER — ACETAMINOPHEN 500 MG PO TABS
1000.0000 mg | ORAL_TABLET | ORAL | Status: AC
  Administered 2020-07-24: 1000 mg via ORAL
  Filled 2020-07-24: qty 2

## 2020-07-24 MED ORDER — BUPIVACAINE HCL (PF) 0.25 % IJ SOLN
INTRAMUSCULAR | Status: DC | PRN
  Administered 2020-07-24: 20 mL

## 2020-07-24 MED ORDER — CHLORHEXIDINE GLUCONATE 0.12 % MT SOLN
15.0000 mL | Freq: Once | OROMUCOSAL | Status: AC
  Administered 2020-07-24: 15 mL via OROMUCOSAL
  Filled 2020-07-24: qty 15

## 2020-07-24 MED ORDER — SODIUM CHLORIDE 0.9 % IR SOLN
Status: DC | PRN
  Administered 2020-07-24: 1000 mL

## 2020-07-24 MED ORDER — PROPOFOL 10 MG/ML IV BOLUS
INTRAVENOUS | Status: AC
  Filled 2020-07-24: qty 20

## 2020-07-24 MED ORDER — LIDOCAINE 2% (20 MG/ML) 5 ML SYRINGE
INTRAMUSCULAR | Status: DC | PRN
  Administered 2020-07-24: 80 mg via INTRAVENOUS

## 2020-07-24 MED ORDER — SUGAMMADEX SODIUM 200 MG/2ML IV SOLN
INTRAVENOUS | Status: DC | PRN
  Administered 2020-07-24: 200 mg via INTRAVENOUS

## 2020-07-24 MED ORDER — PHENYLEPHRINE HCL-NACL 10-0.9 MG/250ML-% IV SOLN: INTRAVENOUS | Status: DC | PRN

## 2020-07-24 MED ORDER — HYDROMORPHONE HCL 1 MG/ML IJ SOLN: 0.2500 mg | INTRAMUSCULAR | Status: DC | PRN

## 2020-07-24 MED ORDER — DEXAMETHASONE SODIUM PHOSPHATE 10 MG/ML IJ SOLN
INTRAMUSCULAR | Status: DC | PRN
  Administered 2020-07-24: 10 mg via INTRAVENOUS

## 2020-07-24 SURGICAL SUPPLY — 38 items
ADH SKN CLS APL DERMABOND .7 (GAUZE/BANDAGES/DRESSINGS) ×1
APL PRP STRL LF DISP 70% ISPRP (MISCELLANEOUS) ×1
APPLIER CLIP 5 13 M/L LIGAMAX5 (MISCELLANEOUS) ×2
APR CLP MED LRG 5 ANG JAW (MISCELLANEOUS) ×1
BAG SPEC RTRVL LRG 6X4 10 (ENDOMECHANICALS) ×1
CANISTER SUCT 3000ML PPV (MISCELLANEOUS) ×2 IMPLANT
CATH REDDICK CHOLANGI 4FR 50CM (CATHETERS) ×2 IMPLANT
CHLORAPREP W/TINT 26 (MISCELLANEOUS) ×2 IMPLANT
CLIP APPLIE 5 13 M/L LIGAMAX5 (MISCELLANEOUS) ×1 IMPLANT
COVER MAYO STAND STRL (DRAPES) ×2 IMPLANT
COVER SURGICAL LIGHT HANDLE (MISCELLANEOUS) ×2 IMPLANT
COVER WAND RF STERILE (DRAPES) ×2 IMPLANT
DERMABOND ADVANCED (GAUZE/BANDAGES/DRESSINGS) ×1
DERMABOND ADVANCED .7 DNX12 (GAUZE/BANDAGES/DRESSINGS) ×1 IMPLANT
DRAPE C-ARM 42X120 X-RAY (DRAPES) ×2 IMPLANT
ELECT REM PT RETURN 9FT ADLT (ELECTROSURGICAL) ×2
ELECTRODE REM PT RTRN 9FT ADLT (ELECTROSURGICAL) ×1 IMPLANT
GLOVE BIO SURGEON STRL SZ7.5 (GLOVE) ×2 IMPLANT
GOWN STRL REUS W/ TWL LRG LVL3 (GOWN DISPOSABLE) ×3 IMPLANT
GOWN STRL REUS W/TWL LRG LVL3 (GOWN DISPOSABLE) ×6
IV CATH 14GX2 1/4 (CATHETERS) ×2 IMPLANT
KIT BASIN OR (CUSTOM PROCEDURE TRAY) ×2 IMPLANT
KIT TURNOVER KIT B (KITS) ×2 IMPLANT
NS IRRIG 1000ML POUR BTL (IV SOLUTION) ×2 IMPLANT
PAD ARMBOARD 7.5X6 YLW CONV (MISCELLANEOUS) ×2 IMPLANT
POUCH SPECIMEN RETRIEVAL 10MM (ENDOMECHANICALS) ×2 IMPLANT
SCISSORS LAP 5X35 DISP (ENDOMECHANICALS) ×2 IMPLANT
SET IRRIG TUBING LAPAROSCOPIC (IRRIGATION / IRRIGATOR) ×2 IMPLANT
SET TUBE SMOKE EVAC HIGH FLOW (TUBING) ×2 IMPLANT
SLEEVE ENDOPATH XCEL 5M (ENDOMECHANICALS) ×4 IMPLANT
SPECIMEN JAR SMALL (MISCELLANEOUS) ×2 IMPLANT
SUT MNCRL AB 4-0 PS2 18 (SUTURE) ×3 IMPLANT
TOWEL GREEN STERILE (TOWEL DISPOSABLE) ×2 IMPLANT
TOWEL GREEN STERILE FF (TOWEL DISPOSABLE) ×2 IMPLANT
TRAY LAPAROSCOPIC MC (CUSTOM PROCEDURE TRAY) ×2 IMPLANT
TROCAR XCEL BLUNT TIP 100MML (ENDOMECHANICALS) ×2 IMPLANT
TROCAR XCEL NON-BLD 5MMX100MML (ENDOMECHANICALS) ×2 IMPLANT
WATER STERILE IRR 1000ML POUR (IV SOLUTION) ×2 IMPLANT

## 2020-07-24 NOTE — Transfer of Care (Signed)
Immediate Anesthesia Transfer of Care Note  Patient: Kristin Spears  Procedure(s) Performed: LAPAROSCOPIC CHOLECYSTECTOMY (N/A Abdomen) INTRAOPERATIVE CHOLANGIOGRAM (N/A Abdomen)  Patient Location: PACU  Anesthesia Type:General  Level of Consciousness: awake, alert  and oriented  Airway & Oxygen Therapy: Patient Spontanous Breathing  Post-op Assessment: Report given to RN and Post -op Vital signs reviewed and stable  Post vital signs: Reviewed and stable  Last Vitals:  Vitals Value Taken Time  BP 137/58 07/24/20 0956  Temp    Pulse 69 07/24/20 0957  Resp 18 07/24/20 0957  SpO2 98 % 07/24/20 0957  Vitals shown include unvalidated device data.  Last Pain:  Vitals:   07/24/20 0745  TempSrc:   PainSc: 0-No pain      Patients Stated Pain Goal: 2 (87/21/58 7276)  Complications: No complications documented.

## 2020-07-24 NOTE — Anesthesia Procedure Notes (Signed)
Procedure Name: Intubation Date/Time: 07/24/2020 8:35 AM Performed by: Clearnce Sorrel, CRNA Pre-anesthesia Checklist: Patient identified, Emergency Drugs available, Suction available, Patient being monitored and Timeout performed Patient Re-evaluated:Patient Re-evaluated prior to induction Oxygen Delivery Method: Circle system utilized Preoxygenation: Pre-oxygenation with 100% oxygen Induction Type: IV induction Ventilation: Mask ventilation without difficulty Laryngoscope Size: Mac and 3 Grade View: Grade I Tube type: Oral Tube size: 7.0 mm Number of attempts: 1 Airway Equipment and Method: Stylet Placement Confirmation: ETT inserted through vocal cords under direct vision,  positive ETCO2 and breath sounds checked- equal and bilateral Secured at: 21 cm Tube secured with: Tape Dental Injury: Teeth and Oropharynx as per pre-operative assessment

## 2020-07-24 NOTE — H&P (Signed)
Kristin Spears  Location: [facility] Patient #: [id number] DOB: 09/21/1966 Married / Language: English / Race: White Female   History of Present Illness The patient is a 54 year old female who presents for a follow-up for Abdominal pain. The patient is a 54 year old female who presents for a follow-up for Breast cancer. The patient is a 54 year old white female who is about 7-1/2 years status post left modified radical mastectomy for a T1cN1a left breast cancer as well as a right simple mastectomy. She tolerated the surgery well. Her reconstruction was done by Dr. Harlow Mares. In October she felt fullness and tenderness in the upper portion of the left breast. This was evaluated with ultrasound and no worrisome lesions were identified. She states that her last MRI was in December 2019 and was also negative. She has been taking anastrozole and seems to be tolerating it well. Since her last visit she did develop several episodes of epigastric abdominal pain. The pain was associated with some nausea but no vomiting. Some of the pain did radiate into her back and shoulder. She was evaluated with ultrasound and found to have gallstones. Her liver functions were normal.   Past Surgical History  Breast Reconstruction  Bilateral. Cesarean Section - 1  Foot Surgery  Bilateral. Mastectomy  Bilateral.  Diagnostic Studies History Colonoscopy  1-5 years ago never Mammogram  >3 years ago 1-3 years ago Pap Smear  1-5 years ago  Allergies  Penicillins  Seldane *ANTIHISTAMINES*  Sulfa Antibiotics  Naldecon *COUGH/COLD/ALLERGY*  Codeine Sulfate *ANALGESICS - OPIOID*  Morphine Sulfate (Concentrate) *ANALGESICS - OPIOID*  Claritin *ANTIHISTAMINES*  Wheat  fentaNYL *ANALGESICS - OPIOID*  Allergies Reconciled   Medication History Levothyroxine Sodium (50MCG Tablet, Oral) Active. Anastrozole (1MG  Tablet, Oral) Active. MetroNIDAZOLE (1% Gel, External)  Active. Omega 3 (1200MG  Capsule, Oral) Active. Multivitamin/Multimineral (Oral) Active. Magnesium Gluconate (500MG  Tablet, Oral) Active. Folbee Plus (Oral) Active. Vitamin D (Cholecalciferol) (1000UNIT Tablet, Oral) Active. B Complex (Oral) Active. Amino Acids Complex (Oral) Active. RisaQuad (Oral) Active. Advil (200MG  Capsule, Oral prn) Active. Mucinex (600MG  Tablet ER, Oral) Active. Allegra Allergy (180MG  Tablet, Oral) Active. Medications Reconciled  Social History  Caffeine use  Tea. No alcohol use  No drug use  Tobacco use  Never smoker.  Family History  Arthritis  Father. Heart Disease  Father, Mother. Prostate Cancer  Father. Thyroid problems  Father, Mother.  Pregnancy / Birth History Age at menarche  79 years. Age of menopause  [age] Contraceptive History  Contraceptive implant, Oral contraceptives. Gravida  3 Irregular periods   Other Problems Anxiety Disorder  Breast Cancer  Cholelithiasis  Depression  Gastric Ulcer  Lump In Breast  Migraine Headache     Review of Systems  Skin Not Present- Change in Wart/Mole, Dryness, Hives, Jaundice, New Lesions, Non-Healing Wounds, Rash and Ulcer. HEENT Present- Seasonal Allergies and Wears glasses/contact lenses. Not Present- Earache, Hearing Loss, Hoarseness, Nose Bleed, Oral Ulcers, Ringing in the Ears, Sinus Pain, Sore Throat, Visual Disturbances and Yellow Eyes. Respiratory Not Present- Bloody sputum, Chronic Cough, Difficulty Breathing, Snoring and Wheezing. Cardiovascular Not Present- Chest Pain, Difficulty Breathing Lying Down, Leg Cramps, Palpitations, Rapid Heart Rate, Shortness of Breath and Swelling of Extremities. Gastrointestinal Present- Abdominal Pain and Hemorrhoids. Not Present- Bloating, Bloody Stool, Change in Bowel Habits, Chronic diarrhea, Constipation, Difficulty Swallowing, Excessive gas, Gets full quickly at meals, Indigestion, Nausea, Rectal Pain and  Vomiting. Female Genitourinary Present- Frequency and Urgency. Not Present- Nocturia, Painful Urination and Pelvic Pain. Musculoskeletal Present- Joint  Pain and Joint Stiffness. Not Present- Back Pain, Muscle Pain, Muscle Weakness and Swelling of Extremities. Endocrine Present- Hot flashes. Not Present- Cold Intolerance, Excessive Hunger, Hair Changes, Heat Intolerance and New Diabetes.  Vitals  Weight: 158.25 lb Height: 66in Body Surface Area: 1.81 m Body Mass Index: 25.54 kg/m  Temp.: 97.49F  Pulse: 85 (Regular)  BP: 122/80(Sitting, Left Arm, Standard)       Physical Exam General Mental Status-Alert. General Appearance-Consistent with stated age. Hydration-Well hydrated. Voice-Normal.  Head and Neck Head-normocephalic, atraumatic with no lesions or palpable masses. Trachea-midline. Thyroid Gland Characteristics - normal size and consistency.  Eye Eyeball - Bilateral-Extraocular movements intact. Sclera/Conjunctiva - Bilateral-No scleral icterus.  Chest and Lung Exam Chest and lung exam reveals -quiet, even and easy respiratory effort with no use of accessory muscles and on auscultation, normal breath sounds, no adventitious sounds and normal vocal resonance. Inspection Chest Wall - Normal. Back - normal.  Breast Note: There is no palpable mass in either reconstructed breast. There is no palpable axillary, supraclavicular, or cervical lymphadenopathy.   Cardiovascular Cardiovascular examination reveals -normal heart sounds, regular rate and rhythm with no murmurs and normal pedal pulses bilaterally.  Abdomen Inspection Inspection of the abdomen reveals - No Hernias. Skin - Scar - no surgical scars. Palpation/Percussion Palpation and Percussion of the abdomen reveal - Soft, Non Tender, No Rebound tenderness, No Rigidity (guarding) and No hepatosplenomegaly. Auscultation Auscultation of the abdomen reveals - Bowel sounds  normal.  Neurologic Neurologic evaluation reveals -alert and oriented x 3 with no impairment of recent or remote memory. Mental Status-Normal.  Musculoskeletal Normal Exam - Left-Upper Extremity Strength Normal and Lower Extremity Strength Normal. Normal Exam - Right-Upper Extremity Strength Normal and Lower Extremity Strength Normal.  Lymphatic Head & Neck  General Head & Neck Lymphatics: Bilateral - Description - Normal. Axillary  General Axillary Region: Bilateral - Description - Normal. Tenderness - Non Tender. Femoral & Inguinal  Generalized Femoral & Inguinal Lymphatics: Bilateral - Description - Normal. Tenderness - Non Tender.    Assessment & Plan  GALLSTONES (K80.20) Impression: The patient is about 7-1/2 years status post bilateral mastectomies for breast cancer. She recently developed some epigastric pain and was evaluated with ultrasound. She was found to have gallstones. Her liver functions were normal. Because of the risk of further painful episodes and possible pancreatitis and thinks she would benefit from having her gallbladder removed. She would also like to have this done. I have discussed with her in detail the risks and benefits of the operation as well as some of the technical aspects including the risk of common bile duct injury and she understands and wishes to proceed. This patient encounter took 20 minutes today to perform the following: take history, perform exam, review outside records, interpret imaging, counsel the patient on their diagnosis and document encounter, findings & plan in the EHR Current Plans Instructed to keep follow-up appointment as scheduled

## 2020-07-24 NOTE — Anesthesia Postprocedure Evaluation (Signed)
Anesthesia Post Note  Patient: Kristin Spears  Procedure(s) Performed: LAPAROSCOPIC CHOLECYSTECTOMY (N/A Abdomen) INTRAOPERATIVE CHOLANGIOGRAM (N/A Abdomen)     Patient location during evaluation: PACU Anesthesia Type: General Level of consciousness: sedated Pain management: pain level controlled Vital Signs Assessment: post-procedure vital signs reviewed and stable Respiratory status: spontaneous breathing and respiratory function stable Cardiovascular status: stable Postop Assessment: no apparent nausea or vomiting Anesthetic complications: no   No complications documented.  Last Vitals:  Vitals:   07/24/20 1025 07/24/20 1030  BP: (!) 151/72   Pulse: 66 62  Resp: 18 17  Temp: (!) 36.4 C   SpO2: 95% 96%    Last Pain:  Vitals:   07/24/20 1030  TempSrc:   PainSc: 0-No pain                 Merlinda Frederick

## 2020-07-24 NOTE — Op Note (Addendum)
07/24/2020  9:50 AM  PATIENT:  Kristin Spears  54 y.o. female  PRE-OPERATIVE DIAGNOSIS:  GALLSTONES  POST-OPERATIVE DIAGNOSIS:  GALLSTONES  PROCEDURE:  Procedure(s): LAPAROSCOPIC CHOLECYSTECTOMY (N/A) INTRAOPERATIVE CHOLANGIOGRAM (N/A)  SURGEON:  Surgeon(s) and Role:    * Jovita Kussmaul, MD - Primary  PHYSICIAN ASSISTANT:   ASSISTANTS: Dr. Rob Hickman   ANESTHESIA:   general  EBL:  10 mL   BLOOD ADMINISTERED:none  DRAINS: none   LOCAL MEDICATIONS USED:  MARCAINE     SPECIMEN:  Source of Specimen:  gallbladder  DISPOSITION OF SPECIMEN:  PATHOLOGY  COUNTS:  YES  TOURNIQUET:  * No tourniquets in log *  DICTATION: .Dragon Dictation     Procedure: After informed consent was obtained the patient was brought to the operating room and placed in the supine position on the operating room table. After adequate induction of general anesthesia the patient's abdomen was prepped with ChloraPrep allowed to dry and draped in usual sterile manner. An appropriate timeout was performed. The area below the umbilicus was infiltrated with quarter percent  Marcaine. A small incision was made with a 15 blade knife. The incision was carried down through the subcutaneous tissue bluntly with a hemostat and [employer] retractors. The linea alba was identified. The linea alba was incised with a 15 blade knife and each side was grasped with Coker clamps. The preperitoneal space was then probed with a hemostat until the peritoneum was opened and access was gained to the abdominal cavity. A 0 Vicryl pursestring stitch was placed in the fascia surrounding the opening. A Hassan cannula was then placed through the opening and anchored in place with the previously placed Vicryl purse string stitch. The abdomen was insufflated with carbon dioxide without difficulty. A laparoscope was inserted through the [facility] cannula in the right upper quadrant was inspected. Next the epigastric region was infiltrated with %  Marcaine. A small incision was made with a 15 blade knife. A 5 mm port was placed bluntly through this incision into the abdominal cavity under direct vision. Next 2 sites were chosen laterally on the right side of the abdomen for placement of 5 mm ports. Each of these areas was infiltrated with quarter percent Marcaine. Small stab incisions were made with a 15 blade knife. 5 mm ports were then placed bluntly through these incisions into the abdominal cavity under direct vision without difficulty. A blunt grasper was placed through the lateralmost 5 mm port and used to grasp the dome of the gallbladder and elevated anteriorly and superiorly. Another blunt grasper was placed through the other 5 mm port and used to retract the body and neck of the gallbladder. A dissector was placed through the epigastric port and using the electrocautery the peritoneal reflection at the gallbladder neck was opened. Blunt dissection was then carried out in this area until the gallbladder neck-cystic duct junction was readily identified and a good window was created. A single clip was placed on the gallbladder neck. A small  ductotomy was made just below the clip with laparoscopic scissors. A 14-gauge Angiocath was then placed through the anterior abdominal wall under direct vision. A Reddick cholangiogram catheter was then placed through the Angiocath and flushed. The catheter was then placed in the cystic duct and anchored in place with a clip. A cholangiogram was obtained that showed no filling defects good emptying into the duodenum an adequate length on the cystic duct. Of note the cystic duct did appear to arise from the right hepatic duct.  The anchoring clip and catheters were then removed from the patient. 3 clips were placed proximally on the cystic duct and the duct was divided between the 2 sets of clips. Posterior to this the cystic artery was identified and again dissected bluntly in a circumferential manner until a good  window  was created. 2 clips were placed proximally and one distally on the artery and the artery was divided between the 2 sets of clips. Next a laparoscopic hook cautery device was used to separate the gallbladder from the liver bed. Prior to completely detaching the gallbladder from the liver bed the liver bed was inspected and several small bleeding points were coagulated with the electrocautery until the area was completely hemostatic. The gallbladder was then detached the rest of it from the liver bed without difficulty. A laparoscopic bag was inserted through the hassan port. The laparoscope was moved to the epigastric port. The gallbladder was placed within the bag and the bag was sealed.  The bag with the gallbladder was then removed with the [facility] cannula through the infraumbilical port without difficulty. The fascial defect was then closed with the previously placed Vicryl pursestring stitch as well as with another figure-of-eight 0 Vicryl stitch. The liver bed was inspected again and found to be hemostatic. The abdomen was irrigated with copious amounts of saline until the effluent was clear. The ports were then removed under direct vision without difficulty and were found to be hemostatic. The gas was allowed to escape. The skin incisions were all closed with interrupted 4-0 Monocryl subcuticular stitches. Dermabond dressings were applied. The patient tolerated the procedure well. At the end of the case all needle sponge and instrument counts were correct. The patient was then awakened and taken to recovery in stable condition  PLAN OF CARE: Discharge to home after PACU  PATIENT DISPOSITION:  PACU - hemodynamically stable.   Delay start of Pharmacological VTE agent (>24hrs) due to surgical blood loss or risk of bleeding: not applicable

## 2020-07-24 NOTE — Interval H&P Note (Signed)
History and Physical Interval Note:  07/24/2020 8:17 AM  Kristin Spears  has presented today for surgery, with the diagnosis of GALLSTONES.  The various methods of treatment have been discussed with the patient and family. After consideration of risks, benefits and other options for treatment, the patient has consented to  Procedure(s): LAPAROSCOPIC CHOLECYSTECTOMY WITH INTRAOPERATIVE CHOLANGIOGRAM (N/A) as a surgical intervention.  The patient's history has been reviewed, patient examined, no change in status, stable for surgery.  I have reviewed the patient's chart and labs.  Questions were answered to the patient's satisfaction.     Autumn Messing III

## 2020-07-25 ENCOUNTER — Encounter (HOSPITAL_COMMUNITY): Payer: Self-pay | Admitting: General Surgery

## 2020-07-25 LAB — SURGICAL PATHOLOGY

## 2020-08-20 ENCOUNTER — Inpatient Hospital Stay: Payer: BC Managed Care – PPO | Attending: Oncology

## 2020-08-20 ENCOUNTER — Other Ambulatory Visit: Payer: Self-pay

## 2020-08-20 DIAGNOSIS — Z170 Estrogen receptor positive status [ER+]: Secondary | ICD-10-CM | POA: Insufficient documentation

## 2020-08-20 DIAGNOSIS — Z79811 Long term (current) use of aromatase inhibitors: Secondary | ICD-10-CM | POA: Insufficient documentation

## 2020-08-20 DIAGNOSIS — Z9013 Acquired absence of bilateral breasts and nipples: Secondary | ICD-10-CM | POA: Diagnosis not present

## 2020-08-20 DIAGNOSIS — C50212 Malignant neoplasm of upper-inner quadrant of left female breast: Secondary | ICD-10-CM | POA: Insufficient documentation

## 2020-08-20 LAB — COMPREHENSIVE METABOLIC PANEL
ALT: 17 U/L (ref 0–44)
AST: 21 U/L (ref 15–41)
Albumin: 3.9 g/dL (ref 3.5–5.0)
Alkaline Phosphatase: 103 U/L (ref 38–126)
Anion gap: 8 (ref 5–15)
BUN: 15 mg/dL (ref 6–20)
CO2: 30 mmol/L (ref 22–32)
Calcium: 9.1 mg/dL (ref 8.9–10.3)
Chloride: 106 mmol/L (ref 98–111)
Creatinine, Ser: 0.96 mg/dL (ref 0.44–1.00)
GFR, Estimated: >60 mL/min (ref >60)
Glucose, Bld: 89 mg/dL (ref 70–99)
Potassium: 4.1 mmol/L (ref 3.5–5.1)
Sodium: 144 mmol/L (ref 135–145)
Total Bilirubin: 0.5 mg/dL (ref 0.3–1.2)
Total Protein: 7.1 g/dL (ref 6.5–8.1)

## 2020-08-20 LAB — CBC WITH DIFFERENTIAL/PLATELET
Abs Immature Granulocytes: 0.01 10*3/uL (ref 0.00–0.07)
Basophils Absolute: 0 10*3/uL (ref 0.0–0.1)
Basophils Relative: 0 %
Eosinophils Absolute: 0.1 10*3/uL (ref 0.0–0.5)
Eosinophils Relative: 2 %
HCT: 40.5 % (ref 36.0–46.0)
Hemoglobin: 13.6 g/dL (ref 12.0–15.0)
Immature Granulocytes: 0 %
Lymphocytes Relative: 36 %
Lymphs Abs: 2.4 10*3/uL (ref 0.7–4.0)
MCH: 32.5 pg (ref 26.0–34.0)
MCHC: 33.6 g/dL (ref 30.0–36.0)
MCV: 96.9 fL (ref 80.0–100.0)
Monocytes Absolute: 0.4 10*3/uL (ref 0.1–1.0)
Monocytes Relative: 6 %
Neutro Abs: 3.7 10*3/uL (ref 1.7–7.7)
Neutrophils Relative %: 56 %
Platelets: 191 10*3/uL (ref 150–400)
RBC: 4.18 MIL/uL (ref 3.87–5.11)
RDW: 12.9 % (ref 11.5–15.5)
WBC: 6.7 10*3/uL (ref 4.0–10.5)
nRBC: 0 % (ref 0.0–0.2)

## 2021-06-09 ENCOUNTER — Other Ambulatory Visit: Payer: Self-pay | Admitting: Oncology

## 2021-06-12 DIAGNOSIS — M8580 Other specified disorders of bone density and structure, unspecified site: Secondary | ICD-10-CM | POA: Insufficient documentation

## 2021-06-27 ENCOUNTER — Other Ambulatory Visit: Payer: Self-pay | Admitting: Internal Medicine

## 2021-06-27 DIAGNOSIS — Z0000 Encounter for general adult medical examination without abnormal findings: Secondary | ICD-10-CM

## 2021-07-10 ENCOUNTER — Ambulatory Visit: Payer: BC Managed Care – PPO | Admitting: Oncology

## 2021-07-10 ENCOUNTER — Other Ambulatory Visit: Payer: BC Managed Care – PPO

## 2021-07-17 ENCOUNTER — Ambulatory Visit
Admission: RE | Admit: 2021-07-17 | Discharge: 2021-07-17 | Disposition: A | Discharge status: Home / Self Care | Payer: No Typology Code available for payment source | Source: Ambulatory Visit | Attending: Internal Medicine | Admitting: Internal Medicine

## 2021-07-17 DIAGNOSIS — Z0000 Encounter for general adult medical examination without abnormal findings: Secondary | ICD-10-CM

## 2021-07-18 ENCOUNTER — Ambulatory Visit (INDEPENDENT_AMBULATORY_CARE_PROVIDER_SITE_OTHER): Payer: BC Managed Care – PPO

## 2021-07-18 ENCOUNTER — Other Ambulatory Visit: Payer: Self-pay

## 2021-07-18 ENCOUNTER — Other Ambulatory Visit: Payer: Self-pay | Admitting: Podiatry

## 2021-07-18 ENCOUNTER — Ambulatory Visit: Payer: BC Managed Care – PPO | Admitting: Podiatry

## 2021-07-18 ENCOUNTER — Encounter: Payer: Self-pay | Admitting: Podiatry

## 2021-07-18 DIAGNOSIS — F909 Attention-deficit hyperactivity disorder, unspecified type: Secondary | ICD-10-CM | POA: Insufficient documentation

## 2021-07-18 DIAGNOSIS — Z0000 Encounter for general adult medical examination without abnormal findings: Secondary | ICD-10-CM | POA: Insufficient documentation

## 2021-07-18 DIAGNOSIS — Z832 Family history of diseases of the blood and blood-forming organs and certain disorders involving the immune mechanism: Secondary | ICD-10-CM

## 2021-07-18 DIAGNOSIS — M7751 Other enthesopathy of right foot: Secondary | ICD-10-CM

## 2021-07-18 DIAGNOSIS — Z853 Personal history of malignant neoplasm of breast: Secondary | ICD-10-CM | POA: Insufficient documentation

## 2021-07-18 DIAGNOSIS — M79671 Pain in right foot: Secondary | ICD-10-CM

## 2021-07-18 DIAGNOSIS — J309 Allergic rhinitis, unspecified: Secondary | ICD-10-CM | POA: Insufficient documentation

## 2021-07-18 DIAGNOSIS — E039 Hypothyroidism, unspecified: Secondary | ICD-10-CM | POA: Insufficient documentation

## 2021-07-18 DIAGNOSIS — F812 Mathematics disorder: Secondary | ICD-10-CM | POA: Insufficient documentation

## 2021-07-18 DIAGNOSIS — F0631 Mood disorder due to known physiological condition with depressive features: Secondary | ICD-10-CM | POA: Insufficient documentation

## 2021-07-18 DIAGNOSIS — L719 Rosacea, unspecified: Secondary | ICD-10-CM | POA: Insufficient documentation

## 2021-07-18 DIAGNOSIS — Z880 Allergy status to penicillin: Secondary | ICD-10-CM | POA: Insufficient documentation

## 2021-07-18 DIAGNOSIS — L301 Dyshidrosis [pompholyx]: Secondary | ICD-10-CM | POA: Insufficient documentation

## 2021-07-18 DIAGNOSIS — R350 Frequency of micturition: Secondary | ICD-10-CM | POA: Insufficient documentation

## 2021-07-18 DIAGNOSIS — F411 Generalized anxiety disorder: Secondary | ICD-10-CM | POA: Insufficient documentation

## 2021-07-18 DIAGNOSIS — R899 Unspecified abnormal finding in specimens from other organs, systems and tissues: Secondary | ICD-10-CM | POA: Insufficient documentation

## 2021-07-18 DIAGNOSIS — H9190 Unspecified hearing loss, unspecified ear: Secondary | ICD-10-CM | POA: Insufficient documentation

## 2021-07-18 HISTORY — DX: Family history of diseases of the blood and blood-forming organs and certain disorders involving the immune mechanism: Z83.2

## 2021-07-18 MED ORDER — TRIAMCINOLONE ACETONIDE 10 MG/ML IJ SUSP
10.0000 mg | Freq: Once | INTRAMUSCULAR | Status: AC
  Administered 2021-07-18: 10 mg

## 2021-07-21 ENCOUNTER — Ambulatory Visit: Payer: BC Managed Care – PPO | Admitting: Podiatry

## 2021-07-22 NOTE — Progress Notes (Signed)
Subjective:   Patient ID: Kristin Spears, female   DOB: 55 y.o.   MRN: [id number]   HPI Patient states that she has been developing pain in her right ankle that is gotten gradually more aggravating and sore with palpation.  States that its been going on and getting gradually more irritated over the last 6 months.  Patient does not smoke and would like to be more active   Review of Systems  All other systems reviewed and are negative.      Objective:  Physical Exam Vitals and nursing note reviewed.  Constitutional:      Appearance: She is well-developed.  Pulmonary:     Effort: Pulmonary effort is normal.  Musculoskeletal:        General: Normal range of motion.  Skin:    General: Skin is warm.  Neurological:     Mental Status: She is alert.    Neurovascular status intact muscle strength was found to be within normal limits with patient found to have moderate splinting of the right side especially with inversion and eversion.  Patient is noted to have inflammation of the right sinus tarsi with inflammation fluid around the joint surface and has good digital perfusion     Assessment:  Inflammatory capsulitis of the right sinus tarsi with inflammation fluid buildup     Plan:  H&P reviewed conditions and went ahead today and did sterile prep and injected the sinus tarsi right 3 mg Kenalog 5 mg Xylocaine to reduce the inflammation of the ankle.  Patient will be seen back depending on symptoms  X-rays indicate that there is no signs of arthritis or other inflammatory condition occurring at this time with no indication of stress fracture or fracture

## 2021-08-03 ENCOUNTER — Other Ambulatory Visit: Payer: Self-pay | Admitting: Oncology
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
## 2021-08-06 ENCOUNTER — Inpatient Hospital Stay: Payer: BC Managed Care – PPO | Admitting: Oncology

## 2021-08-06 ENCOUNTER — Inpatient Hospital Stay: Payer: BC Managed Care – PPO | Attending: Oncology

## 2021-08-06 ENCOUNTER — Other Ambulatory Visit: Payer: Self-pay

## 2021-08-06 VITALS — BP 143/62 | HR 78 | Temp 98.1°F | Resp 16 | Ht 66.0 in | Wt 157.0 lb

## 2021-08-06 DIAGNOSIS — Z9013 Acquired absence of bilateral breasts and nipples: Secondary | ICD-10-CM | POA: Diagnosis not present

## 2021-08-06 DIAGNOSIS — Z170 Estrogen receptor positive status [ER+]: Secondary | ICD-10-CM | POA: Diagnosis not present

## 2021-08-06 DIAGNOSIS — C50212 Malignant neoplasm of upper-inner quadrant of left female breast: Secondary | ICD-10-CM | POA: Insufficient documentation

## 2021-08-06 DIAGNOSIS — Z79811 Long term (current) use of aromatase inhibitors: Secondary | ICD-10-CM | POA: Insufficient documentation

## 2021-08-06 LAB — COMPREHENSIVE METABOLIC PANEL
ALT: 27 U/L (ref 0–44)
AST: 30 U/L (ref 15–41)
Albumin: 4 g/dL (ref 3.5–5.0)
Alkaline Phosphatase: 117 U/L (ref 38–126)
Anion gap: 10 (ref 5–15)
BUN: 19 mg/dL (ref 6–20)
CO2: 25 mmol/L (ref 22–32)
Calcium: 9.2 mg/dL (ref 8.9–10.3)
Chloride: 105 mmol/L (ref 98–111)
Creatinine, Ser: 0.78 mg/dL (ref 0.44–1.00)
GFR, Estimated: >60 mL/min (ref >60)
Glucose, Bld: 119 mg/dL — ABNORMAL HIGH (ref 70–99)
Potassium: 4 mmol/L (ref 3.5–5.1)
Sodium: 140 mmol/L (ref 135–145)
Total Bilirubin: 0.7 mg/dL (ref 0.3–1.2)
Total Protein: 7.2 g/dL (ref 6.5–8.1)

## 2021-08-06 LAB — CBC WITH DIFFERENTIAL/PLATELET
Abs Immature Granulocytes: 0.01 10*3/uL (ref 0.00–0.07)
Basophils Absolute: 0 10*3/uL (ref 0.0–0.1)
Basophils Relative: 0 %
Eosinophils Absolute: 0.2 10*3/uL (ref 0.0–0.5)
Eosinophils Relative: 3 %
HCT: 41.8 % (ref 36.0–46.0)
Hemoglobin: 14.1 g/dL (ref 12.0–15.0)
Immature Granulocytes: 0 %
Lymphocytes Relative: 35 %
Lymphs Abs: 1.9 10*3/uL (ref 0.7–4.0)
MCH: 32.3 pg (ref 26.0–34.0)
MCHC: 33.7 g/dL (ref 30.0–36.0)
MCV: 95.9 fL (ref 80.0–100.0)
Monocytes Absolute: 0.4 10*3/uL (ref 0.1–1.0)
Monocytes Relative: 7 %
Neutro Abs: 3 10*3/uL (ref 1.7–7.7)
Neutrophils Relative %: 55 %
Platelets: 188 10*3/uL (ref 150–400)
RBC: 4.36 MIL/uL (ref 3.87–5.11)
RDW: 12.8 % (ref 11.5–15.5)
WBC: 5.5 10*3/uL (ref 4.0–10.5)
nRBC: 0 % (ref 0.0–0.2)

## 2022-04-11 IMAGING — RF DG CHOLANGIOGRAM OPERATIVE
1 series · 4 of 4 positions shown · non-contrast
Comparison: None

CLINICAL DATA: Intraoperative cholangiogram during laparoscopic
cholecystectomy.

EXAM:
INTRAOPERATIVE CHOLANGIOGRAM
FLUOROSCOPY TIME:  13 seconds (2.3 mGy)

[Series 1: run · 4 of 79 frames shown]
[frame 3/79]
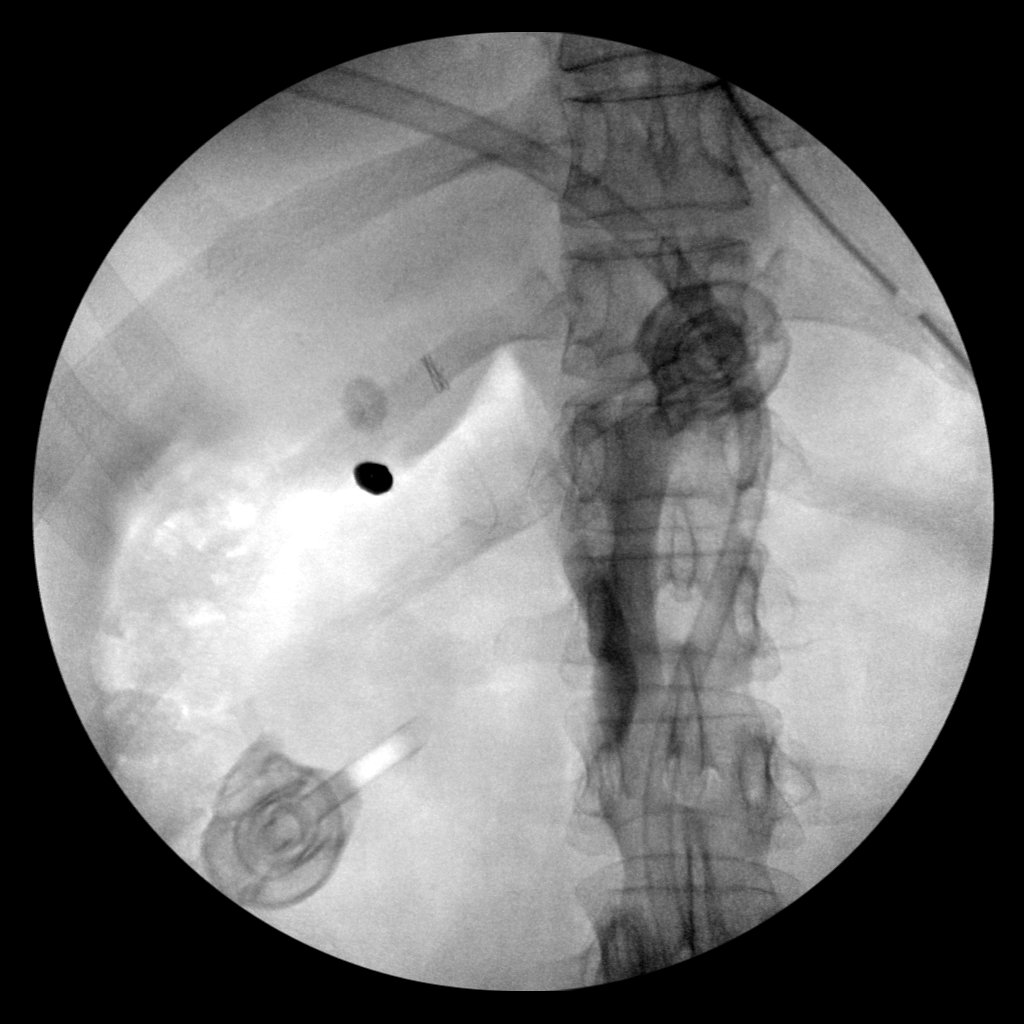
[frame 12/79]
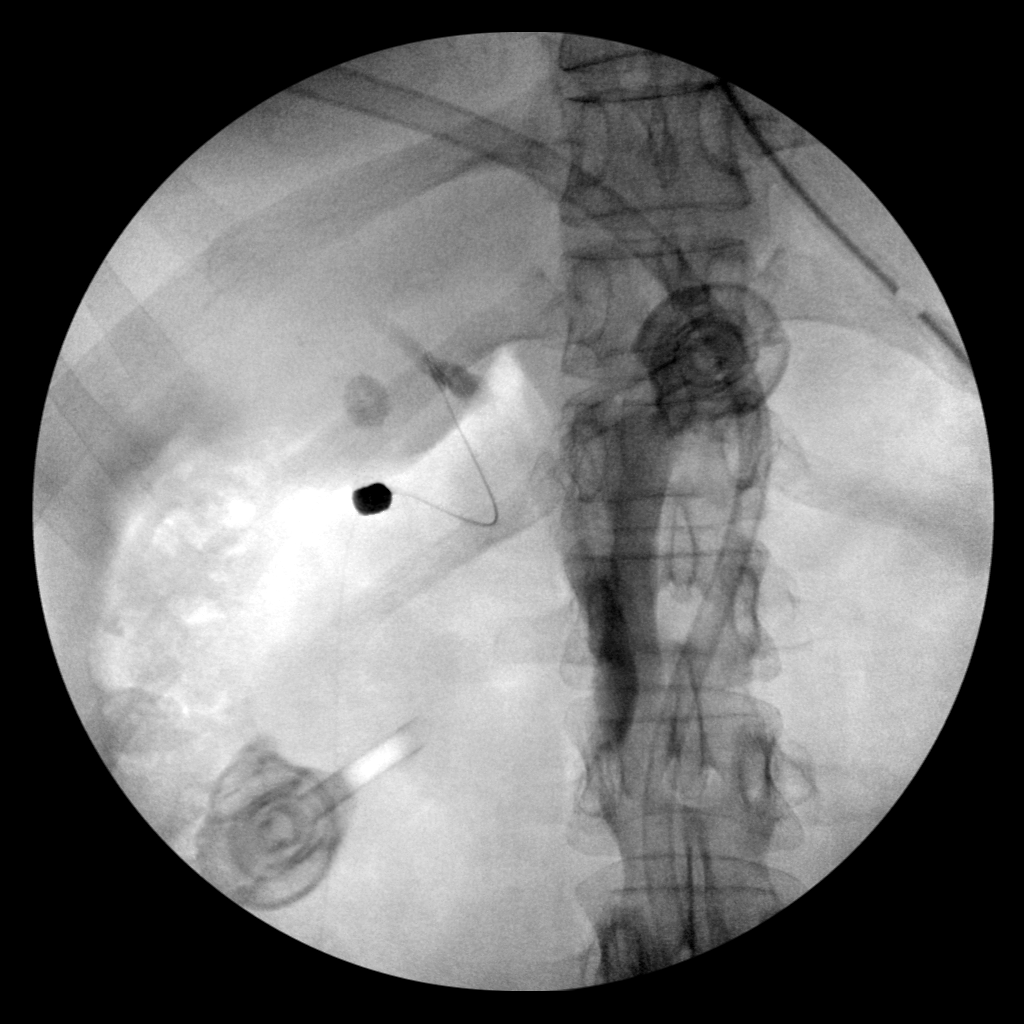
[frame 40/79]
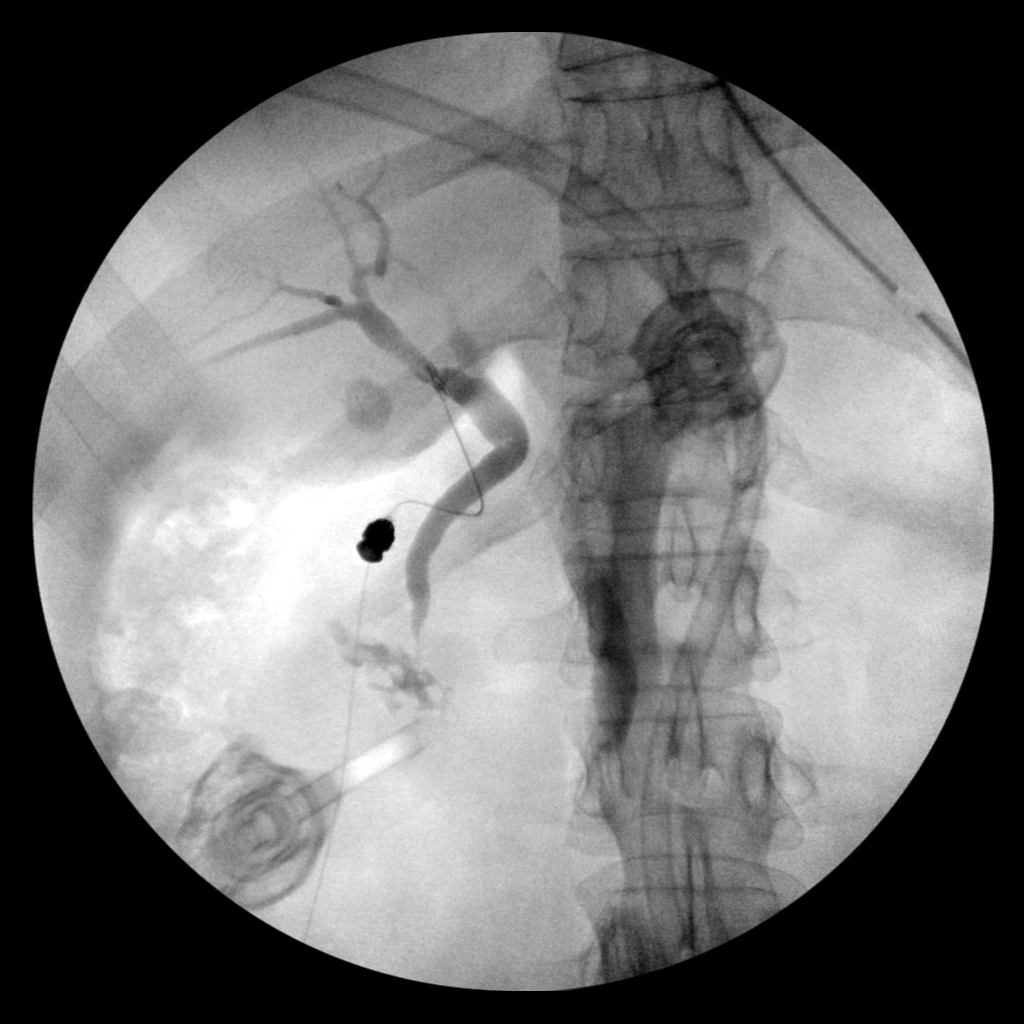
[frame 68/79]
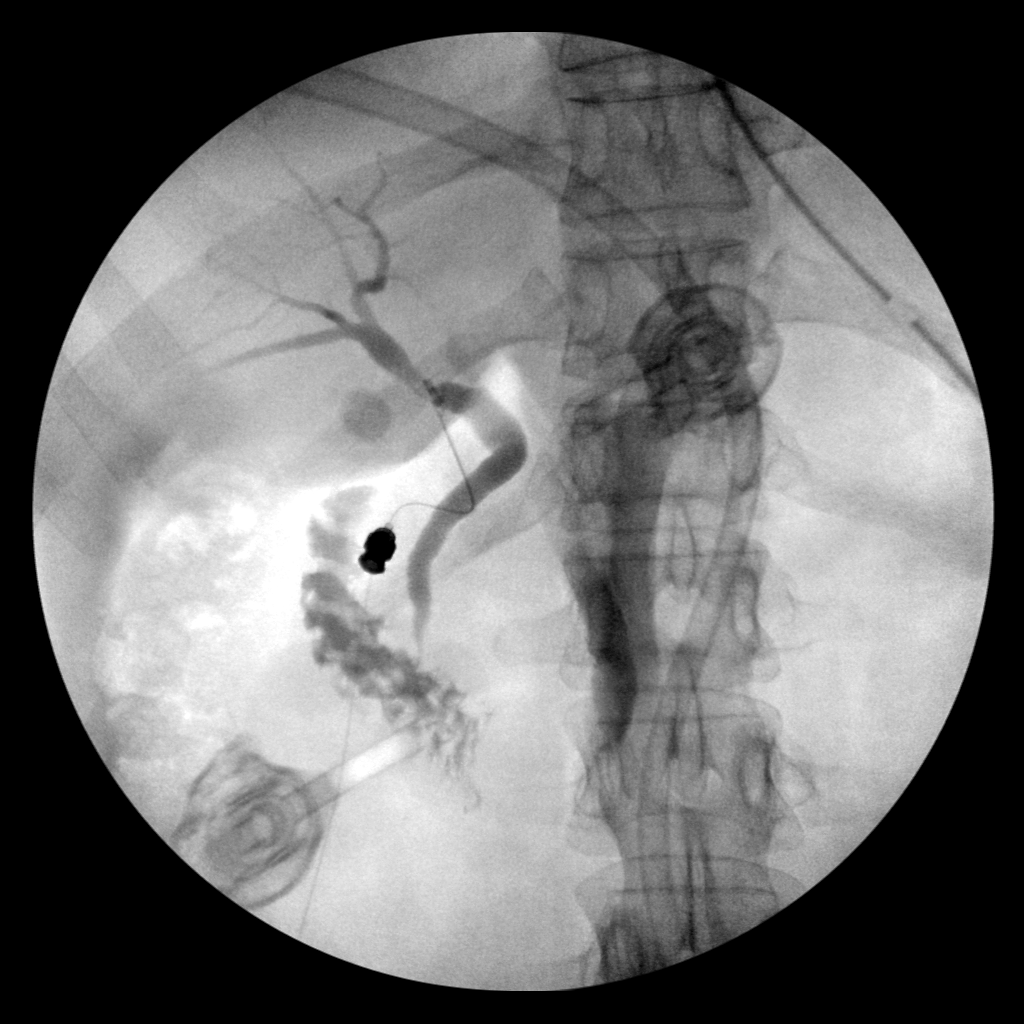

[4 of 4 positions shown; findings below may reference images not displayed]

FINDINGS: Intraoperative cholangiographic images of the right upper abdominal
quadrant during laparoscopic cholecystectomy are provided for
review.

Surgical clips overlie the expected location of the gallbladder
fossa.

Contrast injection demonstrates apparent cannulation of the central
aspect of the cystic duct which appears to anomalously insert into
the central aspect of the right intrahepatic biliary tree though
this could be artifactual due to obliquity.

There is passage of contrast through the central aspect of the
cystic duct with filling of a non dilated common bile duct. There is
passage of contrast though the CBD and into the descending portion
of the duodenum.

There is minimal reflux of injected contrast into the common hepatic
duct and central aspect of the non dilated intrahepatic biliary
system.

There are no discrete filling defects within the opacified portions
of the biliary system to suggest the presence of
choledocholithiasis.
IMPRESSION: 1. No evidence of choledocholithiasis.
2. Potential anomalously insertion of the cystic duct into the
central aspect of the right intrahepatic biliary tree, potentially
artifactual due to obliquity.

## 2022-07-29 ENCOUNTER — Ambulatory Visit (INDEPENDENT_AMBULATORY_CARE_PROVIDER_SITE_OTHER): Payer: BC Managed Care – PPO

## 2022-07-29 ENCOUNTER — Ambulatory Visit: Payer: BC Managed Care – PPO | Admitting: Podiatry

## 2022-07-29 ENCOUNTER — Encounter: Payer: Self-pay | Admitting: Podiatry

## 2022-07-29 DIAGNOSIS — M722 Plantar fascial fibromatosis: Secondary | ICD-10-CM

## 2022-07-29 DIAGNOSIS — M778 Other enthesopathies, not elsewhere classified: Secondary | ICD-10-CM | POA: Diagnosis not present

## 2022-07-29 MED ORDER — TRIAMCINOLONE ACETONIDE 10 MG/ML IJ SUSP
10.0000 mg | Freq: Once | INTRAMUSCULAR | Status: AC
  Administered 2022-07-29: 10 mg

## 2022-07-29 NOTE — Progress Notes (Signed)
[id number]  

## 2022-07-29 NOTE — Progress Notes (Signed)
Subjective:   Patient ID: Kristin Spears, female   DOB: 56 y.o.   MRN: [id number]   HPI Patient presents with a lot of pain on top of the right foot for about a year.  States its soreness with pulling sensation and she has not had it and it got worse last couple months   ROS      Objective:  Physical Exam  There are status intact good structure first MPJ after bunion correction that was done with pain in the midtarsal joint right dorsal and into the extensor complex     Assessment:  Inflammatory tendinitis with extensor like inflammation with possibility for low-grade midtarsal joint arthritis     Plan:  H&P explained to patient reviewed risk of injections sterile prep and injected around the midtarsal joint extensor complex 3 mg Dexasone Kenalog 5 mg Xylocaine into the sheath and not directly into the tendon.  Begin Voltaren cream reappoint as needed  X-rays indicate that there is a previous bunion right looks good fixation in place mild arthritis around the midtarsal joint right

## 2022-10-29 DIAGNOSIS — R32 Unspecified urinary incontinence: Secondary | ICD-10-CM | POA: Insufficient documentation

## 2022-11-17 ENCOUNTER — Encounter: Payer: Self-pay | Admitting: Pulmonary Disease

## 2022-11-17 ENCOUNTER — Ambulatory Visit (INDEPENDENT_AMBULATORY_CARE_PROVIDER_SITE_OTHER): Payer: BC Managed Care – PPO | Admitting: Pulmonary Disease

## 2022-11-17 VITALS — BP 118/74 | HR 67 | Ht 66.0 in | Wt 172.4 lb

## 2022-11-17 DIAGNOSIS — Z91199 Patient's noncompliance with other medical treatment and regimen due to unspecified reason: Secondary | ICD-10-CM
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
## 2022-12-20 NOTE — Progress Notes (Deleted)
  Cardiology Office Note:   Date:  12/20/2022  ID:  Kristin Spears, DOB 02/18/1966, MRN [id number]  History of Present Illness:   Kristin Spears is a 57 y.o. female ER positive breast cancer s/p bilateral mastectomy and hypothyroidism who was referred by Dr. Shelia Media for further evaluation given family history of CAD.  Patient seen by Dr. Shelia Media on 11/25/22. Notes reviewed. Patient states ***    ROS: ***  Studies Reviewed:    EKG:  ***      Risk Assessment/Calculations:   {Does this patient have ATRIAL FIBRILLATION?:[phone]} No BP recorded.  {Refresh Note OR Click here to enter BP  :1}***        Physical Exam:   VS:  LMP 10/17/2015 Comment: with break through bleeding 1x year   Wt Readings from Last 3 Encounters:  11/17/22 172 lb 6.4 oz (78.2 kg)  08/06/21 157 lb (71.2 kg)  07/24/20 153 lb 3.2 oz (69.5 kg)     GEN: Well nourished, well developed in no acute distress NECK: No JVD; No carotid bruits CARDIAC: ***RRR, no murmurs, rubs, gallops RESPIRATORY:  Clear to auscultation without rales, wheezing or rhonchi  ABDOMEN: Soft, non-tender, non-distended EXTREMITIES:  No edema; No deformity   ASSESSMENT AND PLAN:    #Family History of CAD: -Check Ca score  #History of ER positive breast cancer: -Followed by Oncology -??chemo/XRT    {Are you ordering a CV Procedure (e.g. stress test, cath, DCCV, TEE, etc)?   Press F2        :[id number]   Signed, Freada Bergeron, MD

## 2022-12-24 ENCOUNTER — Ambulatory Visit: Payer: BC Managed Care – PPO | Attending: Cardiology | Admitting: Cardiology

## 2023-01-05 ENCOUNTER — Encounter: Payer: Self-pay | Admitting: Pulmonary Disease

## 2023-01-05 NOTE — Progress Notes (Signed)
No show
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
## 2023-02-09 ENCOUNTER — Ambulatory Visit: Payer: BC Managed Care – PPO | Attending: Cardiology | Admitting: Cardiovascular Disease

## 2023-02-09 ENCOUNTER — Encounter: Payer: Self-pay | Admitting: Cardiovascular Disease

## 2023-02-09 VITALS — BP 118/82 | HR 69 | Ht 65.0 in | Wt 169.2 lb

## 2023-02-09 DIAGNOSIS — Z8249 Family history of ischemic heart disease and other diseases of the circulatory system: Secondary | ICD-10-CM

## 2023-02-09 NOTE — Patient Instructions (Signed)
Medication Instructions:  Your physician recommends that you continue on your current medications as directed. Please refer to the Current Medication list given to you today.  *If you need a refill on your cardiac medications before your next appointment, please call your pharmacy*  Lab Work: NONE If you have labs (blood work) drawn today and your tests are completely normal, you will receive your results only by: MyChart Message (if you have MyChart) OR A paper copy in the mail If you have any lab test that is abnormal or we need to change your treatment, we will call you to review the results.  Testing/Procedures: Coronary Calcium score CT scan Your physician has requested that you have cardiac CT. Cardiac computed tomography (CT) is a painless test that uses an x-ray machine to take clear, detailed pictures of your heart. For further information please visit [email or web address] Please follow instruction sheet as given.  Follow-Up: At [facility], you and your health needs are our priority.  As part of our continuing mission to provide you with exceptional heart care, we have created designated Provider Care Teams.  These Care Teams include your primary Cardiologist (physician) and Advanced Practice Providers (APPs -  Physician Assistants and Nurse Practitioners) who all work together to provide you with the care you need, when you need it.  Your next appointment:   1 year(s)  Provider:   Kristeen Miss, MD

## 2023-03-05 ENCOUNTER — Encounter: Payer: Self-pay | Admitting: Podiatry

## 2023-03-05 ENCOUNTER — Ambulatory Visit: Payer: BC Managed Care – PPO | Admitting: Podiatry

## 2023-03-05 DIAGNOSIS — M79671 Pain in right foot: Secondary | ICD-10-CM

## 2023-03-08 ENCOUNTER — Telehealth: Payer: Self-pay | Admitting: Cardiovascular Disease

## 2023-03-08 NOTE — Telephone Encounter (Signed)
Pt mailed results of a lipid tests. Will be put into providers box by eod.

## 2023-03-08 NOTE — Progress Notes (Signed)
Subjective:   Patient ID: Kristin Spears, female   DOB: 57 y.o.   MRN: [id number]   HPI Patient presents concerned about a vibrating sensation in her right foot and cannot identify the cause.  States it is not severely painful but she wants it checked and she has just taken off vitamin B12 shots which were improving the condition but her levels were too high   ROS      Objective:  Physical Exam  Neurovascular status found to be intact did not note any deficit currently sharp dull vibratory good range of motion no muscle strength loss with no current discomfort noted but no other pathology     Assessment:  Difficult to say what might be the genesis of this but it may be more of a temporary condition versus a long-term condition     Plan:  H&P reviewed and I have recommended utilization of soaks therapy and stretching exercises.  Patient will be seen back to recheck should be uneventful if it gets worse I will send a neurologist but think premature at this point

## 2023-03-12 ENCOUNTER — Ambulatory Visit (HOSPITAL_BASED_OUTPATIENT_CLINIC_OR_DEPARTMENT_OTHER): Payer: BC Managed Care – PPO

## 2023-03-12 NOTE — Telephone Encounter (Signed)
Lipid results received-she had spoken with MD during recent OV about her LDL value and plan was already in place to order coronary calcium score CT. Will have results scanned to chart and route message to [facility] team to get CT scheduled.

## 2023-07-02 LAB — LAB REPORT - SCANNED: EGFR: 81

## 2023-07-07 ENCOUNTER — Other Ambulatory Visit (HOSPITAL_COMMUNITY): Payer: Self-pay | Admitting: Internal Medicine

## 2023-07-07 DIAGNOSIS — E7800 Pure hypercholesterolemia, unspecified: Secondary | ICD-10-CM

## 2023-09-27 ENCOUNTER — Ambulatory Visit (HOSPITAL_BASED_OUTPATIENT_CLINIC_OR_DEPARTMENT_OTHER): Payer: BC Managed Care – PPO

## 2023-09-27 ENCOUNTER — Ambulatory Visit (HOSPITAL_BASED_OUTPATIENT_CLINIC_OR_DEPARTMENT_OTHER)
Admission: RE | Admit: 2023-09-27 | Discharge: 2023-09-27 | Disposition: A | Discharge status: Home / Self Care | Payer: Self-pay | Source: Ambulatory Visit | Attending: Cardiovascular Disease | Admitting: Cardiovascular Disease

## 2023-09-27 DIAGNOSIS — Z8249 Family history of ischemic heart disease and other diseases of the circulatory system: Secondary | ICD-10-CM | POA: Insufficient documentation

## 2024-07-05 ENCOUNTER — Encounter: Payer: Self-pay | Admitting: *Deleted
